# Patient Record
Sex: Male | Born: 1950
Health system: Southern US, Community
[De-identification: ages and names within clinical notes are randomized; demographics above are authoritative.]

## PROBLEM LIST (undated history)

## (undated) DIAGNOSIS — E785 Hyperlipidemia, unspecified: Secondary | ICD-10-CM

## (undated) DIAGNOSIS — I10 Essential (primary) hypertension: Secondary | ICD-10-CM

## (undated) DIAGNOSIS — E039 Hypothyroidism, unspecified: Secondary | ICD-10-CM

## (undated) DIAGNOSIS — I219 Acute myocardial infarction, unspecified: Secondary | ICD-10-CM

## (undated) DIAGNOSIS — M1990 Unspecified osteoarthritis, unspecified site: Secondary | ICD-10-CM

## (undated) DIAGNOSIS — K635 Polyp of colon: Secondary | ICD-10-CM

## (undated) HISTORY — DX: Acute myocardial infarction, unspecified: I21.9

## (undated) HISTORY — DX: Essential (primary) hypertension: I10

## (undated) HISTORY — DX: Polyp of colon: K63.5

## (undated) HISTORY — PX: OTHER SURGICAL HISTORY: SHX169

## (undated) HISTORY — DX: Hyperlipidemia, unspecified: E78.5

## (undated) HISTORY — DX: Hypothyroidism, unspecified: E03.9

---

## 1976-01-05 HISTORY — PX: AMPUTATION FINGER / THUMB: SUR24

## 1998-01-04 HISTORY — PX: ARTHROSCOPIC HAGLUNDS REPAIR: SHX5187

## 1999-08-27 ENCOUNTER — Ambulatory Visit (HOSPITAL_COMMUNITY): Admission: RE | Admit: 1999-08-27 | Discharge: 1999-08-27 | Payer: Self-pay | Admitting: Orthopedic Surgery

## 1999-10-22 ENCOUNTER — Encounter: Payer: Self-pay | Admitting: Emergency Medicine

## 1999-10-22 ENCOUNTER — Emergency Department (HOSPITAL_COMMUNITY): Admission: EM | Admit: 1999-10-22 | Discharge: 1999-10-22 | Payer: Self-pay | Admitting: Emergency Medicine

## 2000-10-14 ENCOUNTER — Ambulatory Visit (HOSPITAL_COMMUNITY): Admission: RE | Admit: 2000-10-14 | Discharge: 2000-10-14 | Payer: Self-pay | Admitting: Gastroenterology

## 2001-10-12 ENCOUNTER — Encounter: Payer: Self-pay | Admitting: *Deleted

## 2001-10-12 ENCOUNTER — Inpatient Hospital Stay (HOSPITAL_COMMUNITY): Admission: EM | Admit: 2001-10-12 | Discharge: 2001-10-17 | Payer: Self-pay | Admitting: Emergency Medicine

## 2001-11-06 ENCOUNTER — Encounter (HOSPITAL_COMMUNITY): Admission: RE | Admit: 2001-11-06 | Discharge: 2002-02-04 | Payer: Self-pay | Admitting: Cardiology

## 2002-07-21 ENCOUNTER — Emergency Department (HOSPITAL_COMMUNITY): Admission: EM | Admit: 2002-07-21 | Discharge: 2002-07-22 | Payer: Self-pay | Admitting: Emergency Medicine

## 2002-07-22 ENCOUNTER — Encounter: Payer: Self-pay | Admitting: Emergency Medicine

## 2005-07-21 ENCOUNTER — Emergency Department (HOSPITAL_COMMUNITY): Admission: EM | Admit: 2005-07-21 | Discharge: 2005-07-21 | Payer: Self-pay | Admitting: Emergency Medicine

## 2011-03-16 ENCOUNTER — Encounter: Payer: Self-pay | Admitting: *Deleted

## 2012-03-09 ENCOUNTER — Encounter: Payer: Self-pay | Admitting: Cardiology

## 2012-04-12 ENCOUNTER — Encounter: Payer: Self-pay | Admitting: Cardiology

## 2012-05-13 ENCOUNTER — Emergency Department (HOSPITAL_COMMUNITY): Payer: BC Managed Care – PPO

## 2012-05-13 ENCOUNTER — Encounter (HOSPITAL_COMMUNITY): Payer: Self-pay | Admitting: Emergency Medicine

## 2012-05-13 ENCOUNTER — Emergency Department (HOSPITAL_COMMUNITY)
Admission: EM | Admit: 2012-05-13 | Discharge: 2012-05-13 | Disposition: A | Discharge status: Home / Self Care | Payer: BC Managed Care – PPO | Attending: Emergency Medicine | Admitting: Emergency Medicine

## 2012-05-13 DIAGNOSIS — Z79899 Other long term (current) drug therapy: Secondary | ICD-10-CM | POA: Insufficient documentation

## 2012-05-13 DIAGNOSIS — Z860100 Personal history of colon polyps, unspecified: Secondary | ICD-10-CM | POA: Insufficient documentation

## 2012-05-13 DIAGNOSIS — I252 Old myocardial infarction: Secondary | ICD-10-CM | POA: Insufficient documentation

## 2012-05-13 DIAGNOSIS — N200 Calculus of kidney: Secondary | ICD-10-CM | POA: Insufficient documentation

## 2012-05-13 DIAGNOSIS — E039 Hypothyroidism, unspecified: Secondary | ICD-10-CM | POA: Insufficient documentation

## 2012-05-13 DIAGNOSIS — R110 Nausea: Secondary | ICD-10-CM | POA: Insufficient documentation

## 2012-05-13 DIAGNOSIS — Z87891 Personal history of nicotine dependence: Secondary | ICD-10-CM | POA: Insufficient documentation

## 2012-05-13 DIAGNOSIS — S68019A Complete traumatic metacarpophalangeal amputation of unspecified thumb, initial encounter: Secondary | ICD-10-CM | POA: Insufficient documentation

## 2012-05-13 DIAGNOSIS — Z7902 Long term (current) use of antithrombotics/antiplatelets: Secondary | ICD-10-CM | POA: Insufficient documentation

## 2012-05-13 DIAGNOSIS — K8020 Calculus of gallbladder without cholecystitis without obstruction: Secondary | ICD-10-CM | POA: Insufficient documentation

## 2012-05-13 DIAGNOSIS — Z7982 Long term (current) use of aspirin: Secondary | ICD-10-CM | POA: Insufficient documentation

## 2012-05-13 DIAGNOSIS — Z9861 Coronary angioplasty status: Secondary | ICD-10-CM | POA: Insufficient documentation

## 2012-05-13 DIAGNOSIS — E785 Hyperlipidemia, unspecified: Secondary | ICD-10-CM | POA: Insufficient documentation

## 2012-05-13 DIAGNOSIS — I10 Essential (primary) hypertension: Secondary | ICD-10-CM | POA: Insufficient documentation

## 2012-05-13 DIAGNOSIS — N23 Unspecified renal colic: Secondary | ICD-10-CM | POA: Insufficient documentation

## 2012-05-13 LAB — URINE MICROSCOPIC-ADD ON

## 2012-05-13 LAB — URINALYSIS, ROUTINE W REFLEX MICROSCOPIC
Bilirubin Urine: NEGATIVE
Glucose, UA: NEGATIVE mg/dL
Ketones, ur: NEGATIVE mg/dL
Leukocytes, UA: NEGATIVE
Nitrite: NEGATIVE
Protein, ur: NEGATIVE mg/dL
Specific Gravity, Urine: 1.026 (ref 1.005–1.030)
Urobilinogen, UA: 0.2 mg/dL (ref 0.0–1.0)
pH: 5 (ref 5.0–8.0)

## 2012-05-13 MED ORDER — HYDROMORPHONE HCL PF 1 MG/ML IJ SOLN
1.0000 mg | Freq: Once | INTRAMUSCULAR | Status: AC
  Administered 2012-05-13: 1 mg via INTRAVENOUS
  Filled 2012-05-13: qty 1

## 2012-05-13 MED ORDER — OXYCODONE-ACETAMINOPHEN 5-325 MG PO TABS: 1.0000 | ORAL_TABLET | Freq: Four times a day (QID) | ORAL | Status: DC | PRN

## 2012-05-13 MED ORDER — ONDANSETRON HCL 4 MG/2ML IJ SOLN
4.0000 mg | Freq: Once | INTRAMUSCULAR | Status: AC
  Administered 2012-05-13: 4 mg via INTRAVENOUS
  Filled 2012-05-13: qty 2

## 2012-05-13 MED ORDER — SULFAMETHOXAZOLE-TMP DS 800-160 MG PO TABS: 1.0000 | ORAL_TABLET | Freq: Once | ORAL | Status: DC

## 2012-05-13 MED ORDER — TAMSULOSIN HCL 0.4 MG PO CAPS: 0.4000 mg | ORAL_CAPSULE | Freq: Every day | ORAL | Status: DC

## 2012-05-13 MED ORDER — ONDANSETRON HCL 8 MG PO TABS: 8.0000 mg | ORAL_TABLET | Freq: Three times a day (TID) | ORAL | Status: DC | PRN

## 2012-05-13 MED ORDER — SODIUM CHLORIDE 0.9 % IV SOLN
INTRAVENOUS | Status: DC
  Administered 2012-05-13: 11:00:00 via INTRAVENOUS

## 2012-05-13 NOTE — ED Provider Notes (Signed)
History     CSN: [id number]  Arrival date & time 05/13/12  1014   First MD Initiated Contact with Patient 05/13/12 1022      Chief Complaint  Patient presents with  . Flank Pain    (Consider location/radiation/quality/duration/timing/severity/associated sxs/prior treatment) Patient is a 62 y.o. male presenting with flank pain. The history is provided by the patient.  Flank Pain Pertinent negatives include no chest pain, no abdominal pain, no headaches and no shortness of breath.  pt with remote hx kidney stones presents w acute onset right flank pain posterior/laterally while resting in bed this morning. Somewhat different from prior kidney stone pain on right side 7-8 yrs ago. No radiation of pain. Pain is constant, dull, mod-severe, without specific exacerbating or alleviating factors. Denies back injury or strain. No rash/lesions to area of pain. No direct trauma. No dysuria, hematuria or other gu c/o. No scrotal or testicular pain or swelling. No anterior/abd pain or distension. No fever or chills.   Past Medical History  Diagnosis Date  . Colon polyps   . Hypothyroidism   . Essential hypertension, benign   . Other and unspecified hyperlipidemia   . MI (myocardial infarction)     Past Surgical History  Procedure Laterality Date  . Arthroscopic haglunds repair  2000  . Amputation finger / thumb  1978  . Cardiac stents      Family History  Problem Relation Age of Onset  . Cancer Mother   . Stroke Father     History  Substance Use Topics  . Smoking status: Former Smoker    Types: Cigarettes    Quit date: 01/05/1975  . Smokeless tobacco: Never Used  . Alcohol Use: No      Review of Systems  Constitutional: Negative for fever and chills.  HENT: Negative for neck pain.   Eyes: Negative for redness.  Respiratory: Negative for cough and shortness of breath.   Cardiovascular: Negative for chest pain.  Gastrointestinal: Positive for nausea. Negative for vomiting,  abdominal pain and diarrhea.  Genitourinary: Positive for flank pain.  Musculoskeletal: Negative for back pain.  Skin: Negative for rash.  Neurological: Negative for headaches.  Hematological: Does not bruise/bleed easily.  Psychiatric/Behavioral: Negative for confusion.    Allergies  Review of patient's allergies indicates no known allergies.  Home Medications   Current Outpatient Rx  Name  Route  Sig  Dispense  Refill  . aspirin 325 MG tablet   Oral   Take 325 mg by mouth daily.         Marland Kitchen atorvastatin (LIPITOR) 10 MG tablet   Oral   Take 10 mg by mouth daily.         . clopidogrel (PLAVIX) 75 MG tablet   Oral   Take 75 mg by mouth daily.         . fenofibrate (TRICOR) 145 MG tablet   Oral   Take 145 mg by mouth daily.         Marland Kitchen levothyroxine (SYNTHROID, LEVOTHROID) 100 MCG tablet   Oral   Take 100 mcg by mouth daily.         . metoprolol succinate (TOPROL-XL) 50 MG 24 hr tablet   Oral   Take 50 mg by mouth daily. Take with or immediately following a meal.         . nitroGLYCERIN (NITROSTAT) 0.4 MG SL tablet   Sublingual   Place 0.4 mg under the tongue every 5 (five) minutes as needed.         Marland Kitchen  ramipril (ALTACE) 10 MG capsule   Oral   Take 10 mg by mouth daily.           BP 140/83  Pulse 60  Temp(Src) 98 F (36.7 C) (Oral)  Ht 6\' 2"  (1.88 m)  Wt 214 lb (97.07 kg)  BMI 27.46 kg/m2  SpO2 97%  Physical Exam  Nursing note and vitals reviewed. Constitutional: He is oriented to person, place, and time. He appears well-developed and well-nourished. No distress.  HENT:  Mouth/Throat: Oropharynx is clear and moist.  Eyes: Conjunctivae are normal.  Neck: Neck supple. No tracheal deviation present.  Cardiovascular: Normal rate, regular rhythm, normal heart sounds and intact distal pulses.   Pulmonary/Chest: Effort normal and breath sounds normal. No accessory muscle usage. No respiratory distress.  Abdominal: Soft. Bowel sounds are normal. He  exhibits no distension and no mass. There is no tenderness. There is no rebound and no guarding.  Genitourinary:  No cva tenderness. No scrotal/testicular pain or tenderness  Musculoskeletal: Normal range of motion. He exhibits no edema.  Neurological: He is alert and oriented to person, place, and time.  Skin: Skin is warm and dry.  Psychiatric: He has a normal mood and affect.    ED Course  Procedures (including critical care time)  Results for orders placed during the hospital encounter of 05/13/12  URINALYSIS, ROUTINE W REFLEX MICROSCOPIC      Result Value Range   Color, Urine YELLOW  YELLOW   APPearance CLOUDY (*) CLEAR   Specific Gravity, Urine 1.026  1.005 - 1.030   pH 5.0  5.0 - 8.0   Glucose, UA NEGATIVE  NEGATIVE mg/dL   Hgb urine dipstick LARGE (*) NEGATIVE   Bilirubin Urine NEGATIVE  NEGATIVE   Ketones, ur NEGATIVE  NEGATIVE mg/dL   Protein, ur NEGATIVE  NEGATIVE mg/dL   Urobilinogen, UA 0.2  0.0 - 1.0 mg/dL   Nitrite NEGATIVE  NEGATIVE   Leukocytes, UA NEGATIVE  NEGATIVE  URINE MICROSCOPIC-ADD ON      Result Value Range   Squamous Epithelial / LPF RARE  RARE   WBC, UA 0-2  <3 WBC/hpf   RBC / HPF 11-20  <3 RBC/hpf   Bacteria, UA RARE  RARE   Urine-Other MUCOUS PRESENT     Ct Abdomen Pelvis Wo Contrast  05/13/2012  *RADIOLOGY REPORT*  Clinical Data: 62 year old male with left flank, abdominal and pelvic pain.  CT ABDOMEN AND PELVIS WITHOUT CONTRAST  Technique:  Multidetector CT imaging of the abdomen and pelvis was performed following the standard protocol without intravenous contrast.  Comparison: 07/22/2002 CT report - images are not available.  Findings: Mild fatty infiltration of the liver is noted. A few tiny gallstones are noted without CT evidence of acute cholecystitis.  A 5 mm left UPJ calculus is identified causing moderate left hydronephrosis.  A nonobstructing 6 mm left upper pole calculus and a 3 mm left lower pole calculus identified. Probable right renal  cysts are present.  The spleen, pancreas, and adrenal glands are unremarkable.  Please note that parenchymal abnormalities may be missed as intravenous contrast was not administered.  No free fluid, enlarged lymph nodes, biliary dilation or abdominal aortic aneurysm identified.  The bowel, appendix and bladder are unremarkable. No acute or suspicious bony abnormalities are identified. Moderate degenerative disc disease and spondylosis at L4-L5 and L5- S1 noted.  IMPRESSION: 5 mm left UPJ calculus causing moderate left hydronephrosis.  Nonobstructing left renal calculi.  Mild fatty infiltration of the liver.  Cholelithiasis.   Original Report Authenticated By: Harmon Pier, M.D.        MDM  Iv ns. Dilaudid iv. zofran iv. Ua. Ct.  Reviewed nursing notes and prior charts for additional history.   Recheck pain persists. Dilaudid 1 mg iv.  Pain resolved.  Ct returns, discussed w pt/spouse incl left kidney stones, left upj stone (todays pain), and gallstones.  Pt states has seen urologist in past in [place] and willl plan to f/u there Monday.  Recheck pain resolved.   Stable for d/c.     Suzi Roots, MD 05/13/12 1147

## 2012-05-13 NOTE — ED Notes (Signed)
MD Steinl at bedside. 

## 2012-05-13 NOTE — ED Notes (Signed)
Patient presents to ED today with c/o left flank pain. Pt states he feels like he has had kidney stones before and this feels the same. NAD.

## 2012-05-14 ENCOUNTER — Emergency Department (HOSPITAL_COMMUNITY)
Admission: EM | Admit: 2012-05-14 | Discharge: 2012-05-14 | Disposition: A | Discharge status: Home / Self Care | Payer: BC Managed Care – PPO | Attending: Emergency Medicine | Admitting: Emergency Medicine

## 2012-05-14 ENCOUNTER — Encounter (HOSPITAL_COMMUNITY): Payer: Self-pay | Admitting: Emergency Medicine

## 2012-05-14 DIAGNOSIS — I252 Old myocardial infarction: Secondary | ICD-10-CM | POA: Insufficient documentation

## 2012-05-14 DIAGNOSIS — Z860100 Personal history of colon polyps, unspecified: Secondary | ICD-10-CM | POA: Insufficient documentation

## 2012-05-14 DIAGNOSIS — I10 Essential (primary) hypertension: Secondary | ICD-10-CM | POA: Insufficient documentation

## 2012-05-14 DIAGNOSIS — Z79899 Other long term (current) drug therapy: Secondary | ICD-10-CM | POA: Insufficient documentation

## 2012-05-14 DIAGNOSIS — E039 Hypothyroidism, unspecified: Secondary | ICD-10-CM | POA: Insufficient documentation

## 2012-05-14 DIAGNOSIS — Z7982 Long term (current) use of aspirin: Secondary | ICD-10-CM | POA: Insufficient documentation

## 2012-05-14 DIAGNOSIS — E785 Hyperlipidemia, unspecified: Secondary | ICD-10-CM | POA: Insufficient documentation

## 2012-05-14 DIAGNOSIS — Z87891 Personal history of nicotine dependence: Secondary | ICD-10-CM | POA: Insufficient documentation

## 2012-05-14 DIAGNOSIS — N201 Calculus of ureter: Secondary | ICD-10-CM

## 2012-05-14 LAB — URINALYSIS, ROUTINE W REFLEX MICROSCOPIC
Bilirubin Urine: NEGATIVE
Glucose, UA: NEGATIVE mg/dL
Ketones, ur: NEGATIVE mg/dL
Nitrite: NEGATIVE
Protein, ur: NEGATIVE mg/dL
Specific Gravity, Urine: 1.007 (ref 1.005–1.030)
Urobilinogen, UA: 0.2 mg/dL (ref 0.0–1.0)
pH: 6 (ref 5.0–8.0)

## 2012-05-14 LAB — URINE MICROSCOPIC-ADD ON

## 2012-05-14 MED ORDER — HYDROMORPHONE HCL PF 1 MG/ML IJ SOLN
1.0000 mg | Freq: Once | INTRAMUSCULAR | Status: AC
  Administered 2012-05-14: 1 mg via INTRAVENOUS
  Filled 2012-05-14: qty 1

## 2012-05-14 MED ORDER — SODIUM CHLORIDE 0.9 % IV SOLN
Freq: Once | INTRAVENOUS | Status: AC
  Administered 2012-05-14: 13:00:00 via INTRAVENOUS

## 2012-05-14 MED ORDER — ONDANSETRON HCL 4 MG/2ML IJ SOLN
4.0000 mg | Freq: Once | INTRAMUSCULAR | Status: AC
  Administered 2012-05-14: 4 mg via INTRAVENOUS
  Filled 2012-05-14: qty 2

## 2012-05-14 NOTE — ED Notes (Signed)
Pt states symptoms started this morning around 10am. Pt was seen in ED yesterday but has returned today because pain was intense. Pt reports pain is located in LL abdomen and left side of back. Pt has a kidney stone and states he was suppose to the doctor tomorrow. D/c with pain medicine, which was taken but there was not much relief.

## 2012-05-14 NOTE — ED Provider Notes (Signed)
History     CSN: [id number]  Arrival date & time 05/14/12  1126   First MD Initiated Contact with Patient 05/14/12 1155      Chief Complaint  Patient presents with  . Flank Pain    (Consider location/radiation/quality/duration/timing/severity/associated sxs/prior treatment) HPI Patient was seen in the ED yesterday and diagnosed with a 5 mm proximal UPJ stone. He reports he did well during the night however about 9:50 this morning he had acute onset of severe left-sided flank pain that radiates down to his left mid abdomen. He states he took his pain medicines at home without relief. He denies nausea or vomiting but does state she has dry heaves. He denies fever. He does however state he has been having chills. He denies visual hematuria. He states he is seeing Dr. Wanda Plump, urologist in the past and he is going to see him this week.  PCP Dr Neale Burly Urologist Dr Wanda Plump  Past Medical History  Diagnosis Date  . Colon polyps   . Hypothyroidism   . Essential hypertension, benign   . Other and unspecified hyperlipidemia   . MI (myocardial infarction)     Past Surgical History  Procedure Laterality Date  . Arthroscopic haglunds repair  2000  . Amputation finger / thumb  1978  . Cardiac stents      Family History  Problem Relation Age of Onset  . Cancer Mother   . Stroke Father     History  Substance Use Topics  . Smoking status: Former Smoker    Types: Cigarettes    Quit date: 01/05/1975  . Smokeless tobacco: Never Used  . Alcohol Use: No   Patient lives at home Patient lives with spouse   Review of Systems  All other systems reviewed and are negative.    Allergies  Review of patient's allergies indicates no known allergies.  Home Medications   Current Outpatient Rx  Name  Route  Sig  Dispense  Refill  . aspirin EC 81 MG tablet   Oral   Take 81 mg by mouth daily.         . fenofibrate (TRICOR) 145 MG tablet   Oral   Take 145 mg by mouth daily.         Marland Kitchen levothyroxine (SYNTHROID, LEVOTHROID) 100 MCG tablet   Oral   Take 100 mcg by mouth daily.         Marland Kitchen loratadine (CLARITIN) 10 MG tablet   Oral   Take 10 mg by mouth daily as needed for allergies.         . nitroGLYCERIN (NITROSTAT) 0.4 MG SL tablet   Sublingual   Place 0.4 mg under the tongue every 5 (five) minutes as needed.         . ramipril (ALTACE) 10 MG capsule   Oral   Take 10 mg by mouth daily.         . rosuvastatin (CRESTOR) 20 MG tablet   Oral   Take 20 mg by mouth daily.           BP 126/82  Pulse 72  Temp(Src) 98 F (36.7 C) (Oral)  Resp 18  SpO2 98%  Vital signs normal    Physical Exam  Nursing note and vitals reviewed. Constitutional: He is oriented to person, place, and time. He appears well-developed and well-nourished.  Non-toxic appearance. He does not appear ill. No distress.  Patient appears uncomfortable  HENT:  Head: Normocephalic and atraumatic.  Right Ear: External ear  normal.  Left Ear: External ear normal.  Nose: Nose normal. No mucosal edema or rhinorrhea.  Mouth/Throat: Oropharynx is clear and moist and mucous membranes are normal. No dental abscesses or edematous.  Eyes: Conjunctivae and EOM are normal. Pupils are equal, round, and reactive to light.  Neck: Normal range of motion and full passive range of motion without pain. Neck supple.  Cardiovascular: Normal rate, regular rhythm and normal heart sounds.  Exam reveals no gallop and no friction rub.   No murmur heard. Pulmonary/Chest: Effort normal and breath sounds normal. No respiratory distress. He has no wheezes. He has no rhonchi. He has no rales. He exhibits no tenderness and no crepitus.  Abdominal: Soft. Normal appearance and bowel sounds are normal. He exhibits no distension. There is no tenderness. There is no rebound and no guarding.  Patient has no pain to palpation of his abdomen or flank but does indicate his pain is in the left flank and left abdomen.   Musculoskeletal: Normal range of motion. He exhibits no edema and no tenderness.  Moves all extremities well.   Neurological: He is alert and oriented to person, place, and time. He has normal strength. No cranial nerve deficit.  Skin: Skin is warm, dry and intact. No rash noted. No erythema. No pallor.  Psychiatric: He has a normal mood and affect. His speech is normal and behavior is normal. His mood appears not anxious.    ED Course  Procedures (including critical care time)  Medications  0.9 %  sodium chloride infusion ( Intravenous Stopped 05/14/12 1349)  HYDROmorphone (DILAUDID) injection 1 mg (1 mg Intravenous Given 05/14/12 1251)  ondansetron (ZOFRAN) injection 4 mg (4 mg Intravenous Given 05/14/12 1250)   Patient's pain was much improved with IV medications. He feels ready to be discharged. UA was done to make sure he has not developed a UTI because he was having symptoms of chills without obvious fever.  Patient reports he still has plenty of pain medication to take at home.  Results for orders placed during the hospital encounter of 05/14/12  URINALYSIS, ROUTINE W REFLEX MICROSCOPIC      Result Value Range   Color, Urine YELLOW  YELLOW   APPearance CLOUDY (*) CLEAR   Specific Gravity, Urine 1.007  1.005 - 1.030   pH 6.0  5.0 - 8.0   Glucose, UA NEGATIVE  NEGATIVE mg/dL   Hgb urine dipstick LARGE (*) NEGATIVE   Bilirubin Urine NEGATIVE  NEGATIVE   Ketones, ur NEGATIVE  NEGATIVE mg/dL   Protein, ur NEGATIVE  NEGATIVE mg/dL   Urobilinogen, UA 0.2  0.0 - 1.0 mg/dL   Nitrite NEGATIVE  NEGATIVE   Leukocytes, UA SMALL (*) NEGATIVE  URINE MICROSCOPIC-ADD ON      Result Value Range   WBC, UA 0-2  <3 WBC/hpf   RBC / HPF 3-6  <3 RBC/hpf   Laboratory interpretation all normal except hematuria, no evidence of UTI   Ct Abdomen Pelvis Wo Contrast  05/13/2012  IMPRESSION: 5 mm left UPJ calculus causing moderate left hydronephrosis.  Nonobstructing left renal calculi.  Mild fatty  infiltration of the liver.  Cholelithiasis.   Original Report Authenticated By: Harmon Pier, M.D.      1. Ureteral calculus     Plan discharge  Devoria Albe, MD, FACEP   MDM          Ward Givens, MD 05/14/12 [phone]

## 2014-05-21 DIAGNOSIS — I10 Essential (primary) hypertension: Secondary | ICD-10-CM | POA: Diagnosis not present

## 2014-05-21 DIAGNOSIS — E039 Hypothyroidism, unspecified: Secondary | ICD-10-CM | POA: Diagnosis not present

## 2014-05-21 DIAGNOSIS — E785 Hyperlipidemia, unspecified: Secondary | ICD-10-CM | POA: Diagnosis not present

## 2014-05-21 DIAGNOSIS — I2510 Atherosclerotic heart disease of native coronary artery without angina pectoris: Secondary | ICD-10-CM | POA: Diagnosis not present

## 2014-05-21 DIAGNOSIS — Z125 Encounter for screening for malignant neoplasm of prostate: Secondary | ICD-10-CM | POA: Diagnosis not present

## 2014-05-24 DIAGNOSIS — G4700 Insomnia, unspecified: Secondary | ICD-10-CM | POA: Diagnosis not present

## 2014-05-24 DIAGNOSIS — E039 Hypothyroidism, unspecified: Secondary | ICD-10-CM | POA: Diagnosis not present

## 2014-05-24 DIAGNOSIS — N200 Calculus of kidney: Secondary | ICD-10-CM | POA: Diagnosis not present

## 2014-05-24 DIAGNOSIS — Z6833 Body mass index (BMI) 33.0-33.9, adult: Secondary | ICD-10-CM | POA: Diagnosis not present

## 2014-05-24 DIAGNOSIS — D692 Other nonthrombocytopenic purpura: Secondary | ICD-10-CM | POA: Diagnosis not present

## 2014-05-24 DIAGNOSIS — I10 Essential (primary) hypertension: Secondary | ICD-10-CM | POA: Diagnosis not present

## 2014-05-24 DIAGNOSIS — I2510 Atherosclerotic heart disease of native coronary artery without angina pectoris: Secondary | ICD-10-CM | POA: Diagnosis not present

## 2014-05-24 DIAGNOSIS — Z0000 Encounter for general adult medical examination without abnormal findings: Secondary | ICD-10-CM | POA: Diagnosis not present

## 2014-05-24 DIAGNOSIS — E785 Hyperlipidemia, unspecified: Secondary | ICD-10-CM | POA: Diagnosis not present

## 2014-05-24 DIAGNOSIS — I252 Old myocardial infarction: Secondary | ICD-10-CM | POA: Diagnosis not present

## 2014-05-24 DIAGNOSIS — E669 Obesity, unspecified: Secondary | ICD-10-CM | POA: Diagnosis not present

## 2015-04-13 DIAGNOSIS — J209 Acute bronchitis, unspecified: Secondary | ICD-10-CM | POA: Diagnosis not present

## 2015-05-29 DIAGNOSIS — Z125 Encounter for screening for malignant neoplasm of prostate: Secondary | ICD-10-CM | POA: Diagnosis not present

## 2015-05-29 DIAGNOSIS — E038 Other specified hypothyroidism: Secondary | ICD-10-CM | POA: Diagnosis not present

## 2015-05-29 DIAGNOSIS — I10 Essential (primary) hypertension: Secondary | ICD-10-CM | POA: Diagnosis not present

## 2015-06-05 DIAGNOSIS — G4700 Insomnia, unspecified: Secondary | ICD-10-CM | POA: Diagnosis not present

## 2015-06-05 DIAGNOSIS — Z23 Encounter for immunization: Secondary | ICD-10-CM | POA: Diagnosis not present

## 2015-06-05 DIAGNOSIS — E669 Obesity, unspecified: Secondary | ICD-10-CM | POA: Diagnosis not present

## 2015-06-05 DIAGNOSIS — E038 Other specified hypothyroidism: Secondary | ICD-10-CM | POA: Diagnosis not present

## 2015-06-05 DIAGNOSIS — I10 Essential (primary) hypertension: Secondary | ICD-10-CM | POA: Diagnosis not present

## 2015-06-05 DIAGNOSIS — D692 Other nonthrombocytopenic purpura: Secondary | ICD-10-CM | POA: Diagnosis not present

## 2015-06-05 DIAGNOSIS — I2510 Atherosclerotic heart disease of native coronary artery without angina pectoris: Secondary | ICD-10-CM | POA: Diagnosis not present

## 2015-06-05 DIAGNOSIS — E7800 Pure hypercholesterolemia, unspecified: Secondary | ICD-10-CM | POA: Diagnosis not present

## 2015-06-05 DIAGNOSIS — I252 Old myocardial infarction: Secondary | ICD-10-CM | POA: Diagnosis not present

## 2015-06-05 DIAGNOSIS — Z1389 Encounter for screening for other disorder: Secondary | ICD-10-CM | POA: Diagnosis not present

## 2015-06-05 DIAGNOSIS — Z0000 Encounter for general adult medical examination without abnormal findings: Secondary | ICD-10-CM | POA: Diagnosis not present

## 2015-06-05 DIAGNOSIS — I119 Hypertensive heart disease without heart failure: Secondary | ICD-10-CM | POA: Diagnosis not present

## 2015-06-19 DIAGNOSIS — I2510 Atherosclerotic heart disease of native coronary artery without angina pectoris: Secondary | ICD-10-CM | POA: Diagnosis not present

## 2015-06-19 DIAGNOSIS — E039 Hypothyroidism, unspecified: Secondary | ICD-10-CM | POA: Diagnosis not present

## 2015-06-19 DIAGNOSIS — T63481A Toxic effect of venom of other arthropod, accidental (unintentional), initial encounter: Secondary | ICD-10-CM | POA: Diagnosis not present

## 2015-06-19 DIAGNOSIS — I10 Essential (primary) hypertension: Secondary | ICD-10-CM | POA: Diagnosis not present

## 2015-06-19 DIAGNOSIS — T63441A Toxic effect of venom of bees, accidental (unintentional), initial encounter: Secondary | ICD-10-CM | POA: Diagnosis not present

## 2015-06-19 DIAGNOSIS — E7800 Pure hypercholesterolemia, unspecified: Secondary | ICD-10-CM | POA: Diagnosis not present

## 2015-06-19 DIAGNOSIS — L509 Urticaria, unspecified: Secondary | ICD-10-CM | POA: Diagnosis not present

## 2015-09-23 DIAGNOSIS — Z1211 Encounter for screening for malignant neoplasm of colon: Secondary | ICD-10-CM | POA: Diagnosis not present

## 2015-09-23 DIAGNOSIS — Z86010 Personal history of colonic polyps: Secondary | ICD-10-CM | POA: Diagnosis not present

## 2015-09-23 DIAGNOSIS — K5900 Constipation, unspecified: Secondary | ICD-10-CM | POA: Diagnosis not present

## 2015-09-23 DIAGNOSIS — E669 Obesity, unspecified: Secondary | ICD-10-CM | POA: Diagnosis not present

## 2015-10-18 DIAGNOSIS — Z23 Encounter for immunization: Secondary | ICD-10-CM | POA: Diagnosis not present

## 2015-10-22 DIAGNOSIS — D122 Benign neoplasm of ascending colon: Secondary | ICD-10-CM | POA: Diagnosis not present

## 2015-10-22 DIAGNOSIS — K635 Polyp of colon: Secondary | ICD-10-CM | POA: Diagnosis not present

## 2015-10-22 DIAGNOSIS — Z86010 Personal history of colonic polyps: Secondary | ICD-10-CM | POA: Diagnosis not present

## 2015-10-22 DIAGNOSIS — Z1211 Encounter for screening for malignant neoplasm of colon: Secondary | ICD-10-CM | POA: Diagnosis not present

## 2016-01-14 DIAGNOSIS — H40053 Ocular hypertension, bilateral: Secondary | ICD-10-CM | POA: Diagnosis not present

## 2016-06-04 DIAGNOSIS — E038 Other specified hypothyroidism: Secondary | ICD-10-CM | POA: Diagnosis not present

## 2016-06-04 DIAGNOSIS — E7800 Pure hypercholesterolemia, unspecified: Secondary | ICD-10-CM | POA: Diagnosis not present

## 2016-06-04 DIAGNOSIS — I10 Essential (primary) hypertension: Secondary | ICD-10-CM | POA: Diagnosis not present

## 2016-06-04 DIAGNOSIS — Z125 Encounter for screening for malignant neoplasm of prostate: Secondary | ICD-10-CM | POA: Diagnosis not present

## 2016-06-11 DIAGNOSIS — Z6834 Body mass index (BMI) 34.0-34.9, adult: Secondary | ICD-10-CM | POA: Diagnosis not present

## 2016-06-11 DIAGNOSIS — E7800 Pure hypercholesterolemia, unspecified: Secondary | ICD-10-CM | POA: Diagnosis not present

## 2016-06-11 DIAGNOSIS — Z0000 Encounter for general adult medical examination without abnormal findings: Secondary | ICD-10-CM | POA: Diagnosis not present

## 2016-06-11 DIAGNOSIS — Z23 Encounter for immunization: Secondary | ICD-10-CM | POA: Diagnosis not present

## 2016-06-11 DIAGNOSIS — I2510 Atherosclerotic heart disease of native coronary artery without angina pectoris: Secondary | ICD-10-CM | POA: Diagnosis not present

## 2016-06-11 DIAGNOSIS — E038 Other specified hypothyroidism: Secondary | ICD-10-CM | POA: Diagnosis not present

## 2016-06-11 DIAGNOSIS — Z1389 Encounter for screening for other disorder: Secondary | ICD-10-CM | POA: Diagnosis not present

## 2016-06-11 DIAGNOSIS — E668 Other obesity: Secondary | ICD-10-CM | POA: Diagnosis not present

## 2016-06-11 DIAGNOSIS — D692 Other nonthrombocytopenic purpura: Secondary | ICD-10-CM | POA: Diagnosis not present

## 2016-06-11 DIAGNOSIS — I252 Old myocardial infarction: Secondary | ICD-10-CM | POA: Diagnosis not present

## 2016-06-11 DIAGNOSIS — G4709 Other insomnia: Secondary | ICD-10-CM | POA: Diagnosis not present

## 2016-06-11 DIAGNOSIS — I119 Hypertensive heart disease without heart failure: Secondary | ICD-10-CM | POA: Diagnosis not present

## 2016-08-13 DIAGNOSIS — S6991XA Unspecified injury of right wrist, hand and finger(s), initial encounter: Secondary | ICD-10-CM | POA: Diagnosis not present

## 2016-08-13 DIAGNOSIS — Z472 Encounter for removal of internal fixation device: Secondary | ICD-10-CM | POA: Diagnosis not present

## 2016-08-13 DIAGNOSIS — S61031A Puncture wound without foreign body of right thumb without damage to nail, initial encounter: Secondary | ICD-10-CM | POA: Diagnosis not present

## 2016-08-13 DIAGNOSIS — Z0389 Encounter for observation for other suspected diseases and conditions ruled out: Secondary | ICD-10-CM | POA: Diagnosis not present

## 2016-08-13 DIAGNOSIS — S60351A Superficial foreign body of right thumb, initial encounter: Secondary | ICD-10-CM | POA: Diagnosis not present

## 2016-08-24 DIAGNOSIS — L821 Other seborrheic keratosis: Secondary | ICD-10-CM | POA: Diagnosis not present

## 2016-08-24 DIAGNOSIS — X32XXXA Exposure to sunlight, initial encounter: Secondary | ICD-10-CM | POA: Diagnosis not present

## 2016-08-24 DIAGNOSIS — L820 Inflamed seborrheic keratosis: Secondary | ICD-10-CM | POA: Diagnosis not present

## 2016-08-24 DIAGNOSIS — D225 Melanocytic nevi of trunk: Secondary | ICD-10-CM | POA: Diagnosis not present

## 2016-08-24 DIAGNOSIS — L570 Actinic keratosis: Secondary | ICD-10-CM | POA: Diagnosis not present

## 2017-04-14 ENCOUNTER — Encounter (HOSPITAL_COMMUNITY): Payer: Self-pay | Admitting: *Deleted

## 2017-04-14 ENCOUNTER — Emergency Department (HOSPITAL_COMMUNITY)
Admission: EM | Admit: 2017-04-14 | Discharge: 2017-04-14 | Disposition: A | Discharge status: Home / Self Care | Payer: Medicare Other | Attending: Emergency Medicine | Admitting: Emergency Medicine

## 2017-04-14 ENCOUNTER — Emergency Department (HOSPITAL_COMMUNITY): Payer: Medicare Other

## 2017-04-14 DIAGNOSIS — R0981 Nasal congestion: Secondary | ICD-10-CM | POA: Insufficient documentation

## 2017-04-14 DIAGNOSIS — Z89029 Acquired absence of unspecified finger(s): Secondary | ICD-10-CM | POA: Insufficient documentation

## 2017-04-14 DIAGNOSIS — R05 Cough: Secondary | ICD-10-CM | POA: Insufficient documentation

## 2017-04-14 DIAGNOSIS — M25461 Effusion, right knee: Secondary | ICD-10-CM | POA: Insufficient documentation

## 2017-04-14 DIAGNOSIS — M7989 Other specified soft tissue disorders: Secondary | ICD-10-CM | POA: Diagnosis not present

## 2017-04-14 DIAGNOSIS — Z87891 Personal history of nicotine dependence: Secondary | ICD-10-CM | POA: Insufficient documentation

## 2017-04-14 DIAGNOSIS — M25561 Pain in right knee: Secondary | ICD-10-CM | POA: Insufficient documentation

## 2017-04-14 DIAGNOSIS — I10 Essential (primary) hypertension: Secondary | ICD-10-CM | POA: Insufficient documentation

## 2017-04-14 DIAGNOSIS — E039 Hypothyroidism, unspecified: Secondary | ICD-10-CM | POA: Insufficient documentation

## 2017-04-14 DIAGNOSIS — W11XXXA Fall on and from ladder, initial encounter: Secondary | ICD-10-CM | POA: Diagnosis not present

## 2017-04-14 DIAGNOSIS — Z7982 Long term (current) use of aspirin: Secondary | ICD-10-CM | POA: Diagnosis not present

## 2017-04-14 DIAGNOSIS — R509 Fever, unspecified: Secondary | ICD-10-CM | POA: Diagnosis not present

## 2017-04-14 DIAGNOSIS — Z79899 Other long term (current) drug therapy: Secondary | ICD-10-CM | POA: Diagnosis not present

## 2017-04-14 DIAGNOSIS — Z89019 Acquired absence of unspecified thumb: Secondary | ICD-10-CM | POA: Insufficient documentation

## 2017-04-14 DIAGNOSIS — J3489 Other specified disorders of nose and nasal sinuses: Secondary | ICD-10-CM | POA: Diagnosis not present

## 2017-04-14 NOTE — Progress Notes (Signed)
Orthopedic Tech Progress Note Patient Details:  Andrew Greer 1950-04-29 [id number]  Ortho Devices Type of Ortho Device: Knee Immobilizer Ortho Device/Splint Location: RLE Ortho Device/Splint Interventions: Ordered, Application   Post Interventions Patient Tolerated: Well Instructions Provided: Care of device   [name] 04/14/2017, 9:45 PM

## 2017-04-14 NOTE — ED Provider Notes (Signed)
[facility] EMERGENCY DEPARTMENT Provider Note   CSN: [id number] Arrival date & time: 04/14/17  1716     History   Chief Complaint Chief Complaint  Patient presents with  . Knee Injury    HPI Andrew Greer is a 67 y.o. male.  Andrew Greer is a 67 y.o. Male with history of prior MI, hypertension, hypothyroidism, hyperlipidemia, who presents to the ED for evaluation of right knee injury.  Patient reports he was getting down from a ladder around 9 AM this morning and when he stepped down onto the ground he landed on a rock and twisted his knee causing him to go down onto his right knee and right hip.  Patient reports initially his hip was hurting more than his knee, but he is continued to be ambulatory throughout the day and continued working.  Patient reports throughout the day he is have progressive swelling and continued pain in the right knee.  Patient reports his hip actually is not bothering him at all anymore.  He reports his knee feels a bit wobbly and unstable.  He bought a knee brace at [facility], but reports it has not helped much.  Has not taken anything for his pain prior to arrival.  He denies any overlying erythema, reports pain and swelling is primarily located over the medial joint line     Past Medical History:  Diagnosis Date  . Colon polyps   . Essential hypertension, benign   . Hypothyroidism   . MI (myocardial infarction) ([facility])   . Other and unspecified hyperlipidemia     There are no active problems to display for this patient.   Past Surgical History:  Procedure Laterality Date  . AMPUTATION FINGER / THUMB  1978  . ARTHROSCOPIC HAGLUNDS REPAIR  2000  . cardiac stents          Home Medications    Prior to Admission medications   Medication Sig Start Date End Date Taking? Authorizing Provider  aspirin EC 81 MG tablet Take 81 mg by mouth daily.    [provider]  fenofibrate (TRICOR) 145 MG tablet Take 145 mg by mouth daily.     [provider]  levothyroxine (SYNTHROID, LEVOTHROID) 100 MCG tablet Take 100 mcg by mouth daily.    [provider]  loratadine (CLARITIN) 10 MG tablet Take 10 mg by mouth daily as needed for allergies.    [provider]  nitroGLYCERIN (NITROSTAT) 0.4 MG SL tablet Place 0.4 mg under the tongue every 5 (five) minutes as needed.    [provider]  ramipril (ALTACE) 10 MG capsule Take 10 mg by mouth daily.    [provider]  rosuvastatin (CRESTOR) 20 MG tablet Take 20 mg by mouth daily.    [provider]    Family History Family History  Problem Relation Age of Onset  . Cancer Mother   . Stroke Father     Social History Social History   Tobacco Use  . Smoking status: Former Smoker    Types: Cigarettes    Last attempt to quit: 01/05/1975    Years since quitting: 42.3  . Smokeless tobacco: Never Used  Substance Use Topics  . Alcohol use: No  . Drug use: No     Allergies   Patient has no known allergies.   Review of Systems Review of Systems  Constitutional: Negative for chills and fever.  HENT: Positive for congestion and rhinorrhea. Negative for ear discharge, ear  pain and sore throat.   Respiratory: Positive for cough. Negative for shortness of breath and wheezing.   Cardiovascular: Negative for chest pain.  Gastrointestinal: Negative for abdominal pain, nausea and vomiting.  Genitourinary: Negative for dysuria and frequency.  Musculoskeletal: Positive for arthralgias and joint swelling.  Skin: Negative for color change and rash.  Neurological: Negative for weakness and numbness.     Physical Exam Updated Vital Signs BP (!) 157/93 (BP Location: Right Arm)   Pulse 87   Temp 100.2 F (37.9 C) (Oral)   Resp 16   SpO2 99%   Physical Exam  Constitutional: He appears well-developed and well-nourished. No distress.  HENT:  Head: Normocephalic and atraumatic.  Moderate nasal mucosa edema with clear  rhinorrhea, posterior oropharynx clear and moist, with some erythema, no edema or exudates  Eyes: Right eye exhibits no discharge. Left eye exhibits no discharge.  Neck: Neck supple.  Cardiovascular: Normal rate, regular rhythm and normal heart sounds.  Pulmonary/Chest: Effort normal and breath sounds normal. No stridor. No respiratory distress. He has no wheezes. He has no rales.  Musculoskeletal:  Right knee with tenderness and swelling over the medial joint line, no overlying erythema, no obvious palpable deformity.  No posterior or lateral tenderness.  Pt is able to fully extend and flex the knee with some discomfort.  2+ DP and TP pulses, sensation intact, 5/5 strength with dorsi and plantar flexion. No tenderness to palpation over right hip, normal ROM.  Weightbearing limited by pain.  Lymphadenopathy:    He has no cervical adenopathy.  Neurological: He is alert. Coordination normal.  Skin: Skin is warm and dry. Capillary refill takes less than 2 seconds. He is not diaphoretic.  Psychiatric: He has a normal mood and affect. His behavior is normal.  Nursing note and vitals reviewed.    ED Treatments / Results  Labs (all labs ordered are listed, but only abnormal results are displayed) Labs Reviewed - No data to display  EKG None  Radiology Dg Knee Complete 4 Views Right  Result Date: 04/14/2017 CLINICAL DATA:  Twisting injury of the right knee. Possible knee dislocation. EXAM: RIGHT KNEE - COMPLETE 4+ VIEW COMPARISON:  None. FINDINGS: Slight medial femorotibial joint space narrowing with spurring off the medial tibial plateau, degenerative in etiology. Small suprapatellar joint effusion is noted. No joint dislocation is seen. There is no acute fracture. Mild soft tissue induration along the anterior aspect of the knee. IMPRESSION: 1. Medial femorotibial joint space narrowing spurring consistent with mild osteoarthritic change. 2. Small suprapatellar joint effusion. No acute fracture  nor joint dislocation. Electronically Signed   By: Ashley Royalty M.D.   On: 04/14/2017 18:49    Procedures Procedures (including critical care time)  Medications Ordered in ED Medications - No data to display   Initial Impression / Assessment and Plan / ED Course  I have reviewed the triage vital signs and the nursing notes.  Pertinent labs & imaging results that were available during my care of the patient were reviewed by me and considered in my medical decision making (see chart for details).  Patient presents to the ED for evaluation of right knee injury, twisted it coming down from ladder.  Pain and swelling over the medial joint line.  No obvious deformity and neurovascularly intact, normal range of motion some discomfort.  X-ray shows no acute fracture or joint dislocation, does show medial femorotibial joint space narrowing and spurring consistent with osteoarthritis and a small suprapatellar joint effusion.  There  is no overlying erythema or warmth to suggest septic arthritis.  Patient does have low-grade fever here in the emergency department, has been dealing with viral upper respiratory illness.  Lungs are clear to auscultation patient without tachypnea or hypoxia.  Offered chest x-ray but patient declined reports he is feeling well.  Do not think fever is due to joint infection given obvious mechanism of injury.  There is no tenderness over the right hip which patient also fell on during injury today, normal range of motion.  Patient declines x-ray at this time.  Has been bearing weight on the hip without difficulty throughout the day.   Patient placed in the immobilizer, offered crutches but reports he has several sets at home.  Patient to treat with anti-inflammatories, ice and elevation.  Patient to follow-up with orthopedics if pain not improving with this conservative therapy.  Return precautions discussed.  Patient expresses understanding and is in agreement with plan.  Patient  discussed with Dr. Sherry Ruffing, who saw patient as well and agrees with plan.   Final Clinical Impressions(s) / ED Diagnoses   Final diagnoses:  Pain and swelling of right knee    ED Discharge Orders    None       Jacqlyn Larsen, [place] 04/15/17 0205    Tegeler, Gwenyth Allegra, MD 04/15/17 [phone]

## 2017-04-14 NOTE — ED Triage Notes (Signed)
Pt in c/o right knee injury this am, states he tripped and twisted it, swelling noted in triage, ambulatory

## 2017-04-14 NOTE — Discharge Instructions (Signed)
Please remain in knee immobilizer, use crutches you have at home, Tylenol or ibuprofen for pain.  Elevate and use ice.  If pain is not improving please follow-up with orthopedics.  Return to the emergency department for severely worsening pain, swelling, redness or warmth, fevers or other new or concerning symptoms.

## 2017-06-10 DIAGNOSIS — I10 Essential (primary) hypertension: Secondary | ICD-10-CM | POA: Diagnosis not present

## 2017-06-10 DIAGNOSIS — E7800 Pure hypercholesterolemia, unspecified: Secondary | ICD-10-CM | POA: Diagnosis not present

## 2017-06-10 DIAGNOSIS — R82998 Other abnormal findings in urine: Secondary | ICD-10-CM | POA: Diagnosis not present

## 2017-06-10 DIAGNOSIS — E038 Other specified hypothyroidism: Secondary | ICD-10-CM | POA: Diagnosis not present

## 2017-06-10 DIAGNOSIS — Z125 Encounter for screening for malignant neoplasm of prostate: Secondary | ICD-10-CM | POA: Diagnosis not present

## 2017-06-17 DIAGNOSIS — Z6833 Body mass index (BMI) 33.0-33.9, adult: Secondary | ICD-10-CM | POA: Diagnosis not present

## 2017-06-17 DIAGNOSIS — I119 Hypertensive heart disease without heart failure: Secondary | ICD-10-CM | POA: Diagnosis not present

## 2017-06-17 DIAGNOSIS — D692 Other nonthrombocytopenic purpura: Secondary | ICD-10-CM | POA: Diagnosis not present

## 2017-06-17 DIAGNOSIS — E7800 Pure hypercholesterolemia, unspecified: Secondary | ICD-10-CM | POA: Diagnosis not present

## 2017-06-17 DIAGNOSIS — I2510 Atherosclerotic heart disease of native coronary artery without angina pectoris: Secondary | ICD-10-CM | POA: Diagnosis not present

## 2017-06-17 DIAGNOSIS — G4709 Other insomnia: Secondary | ICD-10-CM | POA: Diagnosis not present

## 2017-06-17 DIAGNOSIS — Z0000 Encounter for general adult medical examination without abnormal findings: Secondary | ICD-10-CM | POA: Diagnosis not present

## 2017-06-17 DIAGNOSIS — M25561 Pain in right knee: Secondary | ICD-10-CM | POA: Diagnosis not present

## 2017-06-17 DIAGNOSIS — I252 Old myocardial infarction: Secondary | ICD-10-CM | POA: Diagnosis not present

## 2017-06-17 DIAGNOSIS — Z1389 Encounter for screening for other disorder: Secondary | ICD-10-CM | POA: Diagnosis not present

## 2017-06-17 DIAGNOSIS — I10 Essential (primary) hypertension: Secondary | ICD-10-CM | POA: Diagnosis not present

## 2017-06-17 DIAGNOSIS — E038 Other specified hypothyroidism: Secondary | ICD-10-CM | POA: Diagnosis not present

## 2017-08-22 DIAGNOSIS — H43812 Vitreous degeneration, left eye: Secondary | ICD-10-CM | POA: Diagnosis not present

## 2017-09-07 DIAGNOSIS — H43812 Vitreous degeneration, left eye: Secondary | ICD-10-CM | POA: Diagnosis not present

## 2017-11-18 DIAGNOSIS — Z23 Encounter for immunization: Secondary | ICD-10-CM | POA: Diagnosis not present

## 2017-12-20 DIAGNOSIS — L720 Epidermal cyst: Secondary | ICD-10-CM | POA: Diagnosis not present

## 2017-12-20 DIAGNOSIS — L03317 Cellulitis of buttock: Secondary | ICD-10-CM | POA: Diagnosis not present

## 2017-12-20 DIAGNOSIS — B078 Other viral warts: Secondary | ICD-10-CM | POA: Diagnosis not present

## 2018-02-10 DIAGNOSIS — Z6833 Body mass index (BMI) 33.0-33.9, adult: Secondary | ICD-10-CM | POA: Diagnosis not present

## 2018-02-10 DIAGNOSIS — R509 Fever, unspecified: Secondary | ICD-10-CM | POA: Diagnosis not present

## 2018-02-10 DIAGNOSIS — R05 Cough: Secondary | ICD-10-CM | POA: Diagnosis not present

## 2018-02-10 DIAGNOSIS — J069 Acute upper respiratory infection, unspecified: Secondary | ICD-10-CM | POA: Diagnosis not present

## 2018-02-10 DIAGNOSIS — J029 Acute pharyngitis, unspecified: Secondary | ICD-10-CM | POA: Diagnosis not present

## 2018-03-06 DIAGNOSIS — H2512 Age-related nuclear cataract, left eye: Secondary | ICD-10-CM | POA: Diagnosis not present

## 2018-03-08 DIAGNOSIS — H5213 Myopia, bilateral: Secondary | ICD-10-CM | POA: Diagnosis not present

## 2018-03-08 DIAGNOSIS — H25042 Posterior subcapsular polar age-related cataract, left eye: Secondary | ICD-10-CM | POA: Diagnosis not present

## 2018-03-08 DIAGNOSIS — Z961 Presence of intraocular lens: Secondary | ICD-10-CM | POA: Diagnosis not present

## 2018-05-16 DIAGNOSIS — H2512 Age-related nuclear cataract, left eye: Secondary | ICD-10-CM | POA: Diagnosis not present

## 2018-05-16 DIAGNOSIS — H25812 Combined forms of age-related cataract, left eye: Secondary | ICD-10-CM | POA: Diagnosis not present

## 2018-05-31 DIAGNOSIS — Z20828 Contact with and (suspected) exposure to other viral communicable diseases: Secondary | ICD-10-CM | POA: Diagnosis not present

## 2018-06-14 DIAGNOSIS — E7800 Pure hypercholesterolemia, unspecified: Secondary | ICD-10-CM | POA: Diagnosis not present

## 2018-06-14 DIAGNOSIS — I10 Essential (primary) hypertension: Secondary | ICD-10-CM | POA: Diagnosis not present

## 2018-06-14 DIAGNOSIS — R82998 Other abnormal findings in urine: Secondary | ICD-10-CM | POA: Diagnosis not present

## 2018-06-14 DIAGNOSIS — Z125 Encounter for screening for malignant neoplasm of prostate: Secondary | ICD-10-CM | POA: Diagnosis not present

## 2018-06-14 DIAGNOSIS — E039 Hypothyroidism, unspecified: Secondary | ICD-10-CM | POA: Diagnosis not present

## 2018-06-21 DIAGNOSIS — D692 Other nonthrombocytopenic purpura: Secondary | ICD-10-CM | POA: Diagnosis not present

## 2018-06-21 DIAGNOSIS — I252 Old myocardial infarction: Secondary | ICD-10-CM | POA: Diagnosis not present

## 2018-06-21 DIAGNOSIS — N200 Calculus of kidney: Secondary | ICD-10-CM | POA: Diagnosis not present

## 2018-06-21 DIAGNOSIS — Z0000 Encounter for general adult medical examination without abnormal findings: Secondary | ICD-10-CM | POA: Diagnosis not present

## 2018-06-21 DIAGNOSIS — Z1331 Encounter for screening for depression: Secondary | ICD-10-CM | POA: Diagnosis not present

## 2018-06-21 DIAGNOSIS — G4700 Insomnia, unspecified: Secondary | ICD-10-CM | POA: Diagnosis not present

## 2018-06-21 DIAGNOSIS — D126 Benign neoplasm of colon, unspecified: Secondary | ICD-10-CM | POA: Diagnosis not present

## 2018-06-21 DIAGNOSIS — I2510 Atherosclerotic heart disease of native coronary artery without angina pectoris: Secondary | ICD-10-CM | POA: Diagnosis not present

## 2018-06-21 DIAGNOSIS — Z1339 Encounter for screening examination for other mental health and behavioral disorders: Secondary | ICD-10-CM | POA: Diagnosis not present

## 2018-06-21 DIAGNOSIS — E7800 Pure hypercholesterolemia, unspecified: Secondary | ICD-10-CM | POA: Diagnosis not present

## 2018-06-21 DIAGNOSIS — E039 Hypothyroidism, unspecified: Secondary | ICD-10-CM | POA: Diagnosis not present

## 2018-06-21 DIAGNOSIS — E669 Obesity, unspecified: Secondary | ICD-10-CM | POA: Diagnosis not present

## 2018-06-21 DIAGNOSIS — I119 Hypertensive heart disease without heart failure: Secondary | ICD-10-CM | POA: Diagnosis not present

## 2018-06-21 DIAGNOSIS — R7309 Other abnormal glucose: Secondary | ICD-10-CM | POA: Diagnosis not present

## 2018-09-13 DIAGNOSIS — Z23 Encounter for immunization: Secondary | ICD-10-CM | POA: Diagnosis not present

## 2018-10-17 DIAGNOSIS — B078 Other viral warts: Secondary | ICD-10-CM | POA: Diagnosis not present

## 2018-10-17 DIAGNOSIS — L820 Inflamed seborrheic keratosis: Secondary | ICD-10-CM | POA: Diagnosis not present

## 2019-01-22 DIAGNOSIS — M5442 Lumbago with sciatica, left side: Secondary | ICD-10-CM | POA: Diagnosis not present

## 2019-01-22 DIAGNOSIS — M9903 Segmental and somatic dysfunction of lumbar region: Secondary | ICD-10-CM | POA: Diagnosis not present

## 2019-01-23 DIAGNOSIS — M9903 Segmental and somatic dysfunction of lumbar region: Secondary | ICD-10-CM | POA: Diagnosis not present

## 2019-01-23 DIAGNOSIS — M5442 Lumbago with sciatica, left side: Secondary | ICD-10-CM | POA: Diagnosis not present

## 2019-01-24 DIAGNOSIS — M9903 Segmental and somatic dysfunction of lumbar region: Secondary | ICD-10-CM | POA: Diagnosis not present

## 2019-01-24 DIAGNOSIS — M5442 Lumbago with sciatica, left side: Secondary | ICD-10-CM | POA: Diagnosis not present

## 2019-01-29 DIAGNOSIS — M9903 Segmental and somatic dysfunction of lumbar region: Secondary | ICD-10-CM | POA: Diagnosis not present

## 2019-01-29 DIAGNOSIS — M5442 Lumbago with sciatica, left side: Secondary | ICD-10-CM | POA: Diagnosis not present

## 2019-01-30 DIAGNOSIS — M9903 Segmental and somatic dysfunction of lumbar region: Secondary | ICD-10-CM | POA: Diagnosis not present

## 2019-01-30 DIAGNOSIS — M5442 Lumbago with sciatica, left side: Secondary | ICD-10-CM | POA: Diagnosis not present

## 2019-01-31 DIAGNOSIS — M5442 Lumbago with sciatica, left side: Secondary | ICD-10-CM | POA: Diagnosis not present

## 2019-01-31 DIAGNOSIS — M9903 Segmental and somatic dysfunction of lumbar region: Secondary | ICD-10-CM | POA: Diagnosis not present

## 2019-02-02 ENCOUNTER — Ambulatory Visit: Payer: PRIVATE HEALTH INSURANCE

## 2019-02-02 DIAGNOSIS — Z23 Encounter for immunization: Secondary | ICD-10-CM | POA: Diagnosis not present

## 2019-02-10 ENCOUNTER — Ambulatory Visit: Payer: PRIVATE HEALTH INSURANCE

## 2019-02-23 ENCOUNTER — Ambulatory Visit: Payer: PRIVATE HEALTH INSURANCE

## 2019-03-02 DIAGNOSIS — Z23 Encounter for immunization: Secondary | ICD-10-CM | POA: Diagnosis not present

## 2019-05-23 DIAGNOSIS — M1711 Unilateral primary osteoarthritis, right knee: Secondary | ICD-10-CM | POA: Diagnosis not present

## 2019-05-23 DIAGNOSIS — M25561 Pain in right knee: Secondary | ICD-10-CM | POA: Diagnosis not present

## 2019-06-06 DIAGNOSIS — B078 Other viral warts: Secondary | ICD-10-CM | POA: Diagnosis not present

## 2019-06-15 DIAGNOSIS — E7800 Pure hypercholesterolemia, unspecified: Secondary | ICD-10-CM | POA: Diagnosis not present

## 2019-06-15 DIAGNOSIS — Z961 Presence of intraocular lens: Secondary | ICD-10-CM | POA: Diagnosis not present

## 2019-06-15 DIAGNOSIS — E038 Other specified hypothyroidism: Secondary | ICD-10-CM | POA: Diagnosis not present

## 2019-06-15 DIAGNOSIS — Z125 Encounter for screening for malignant neoplasm of prostate: Secondary | ICD-10-CM | POA: Diagnosis not present

## 2019-06-15 DIAGNOSIS — H40051 Ocular hypertension, right eye: Secondary | ICD-10-CM | POA: Diagnosis not present

## 2019-08-21 DIAGNOSIS — M1711 Unilateral primary osteoarthritis, right knee: Secondary | ICD-10-CM | POA: Diagnosis not present

## 2019-08-21 DIAGNOSIS — M25561 Pain in right knee: Secondary | ICD-10-CM | POA: Diagnosis not present

## 2019-09-19 ENCOUNTER — Encounter (HOSPITAL_COMMUNITY): Payer: PRIVATE HEALTH INSURANCE

## 2019-09-28 ENCOUNTER — Inpatient Hospital Stay: Admit: 2019-09-28 | Payer: PRIVATE HEALTH INSURANCE | Admitting: Orthopedic Surgery

## 2019-09-28 SURGERY — ARTHROPLASTY, KNEE, TOTAL
Anesthesia: Spinal | Site: Knee | Laterality: Right

## 2019-10-25 NOTE — Progress Notes (Signed)
Pt. Needs orders for upcomming surgery.PST appointment and labs on 10/26/19.Thanks.

## 2019-10-25 NOTE — Patient Instructions (Addendum)
DUE TO COVID-19 ONLY ONE VISITOR IS ALLOWED TO COME WITH YOU AND STAY IN THE WAITING ROOM ONLY DURING PRE OP AND PROCEDURE DAY OF SURGERY. THE 1 VISITOR  MAY VISIT WITH YOU AFTER SURGERY IN YOUR PRIVATE ROOM DURING VISITING HOURS ONLY!  YOU NEED TO HAVE A COVID 19 TEST ON: 10/30/19 @ 9:00 AM, THIS TEST MUST BE DONE BEFORE SURGERY,  COVID TESTING SITE [place] [address], IT IS ON THE RIGHT GOING OUT [address] APPROXIMATELY  2 MINUTES PAST [employer] ON THE RIGHT. ONCE YOUR COVID TEST IS COMPLETED,  PLEASE BEGIN THE QUARANTINE INSTRUCTIONS AS OUTLINED IN YOUR HANDOUT.                Andrew Greer   Your procedure is scheduled on: 11/02/19   Report to [facility] Main  Entrance   Report to admitting at: 10:00 AM     Call this number if you have problems the morning of surgery [phone]    Remember: Do not eat solid food :After Midnight. Clear liquids from midnight until: 9:30 am.  CLEAR LIQUID DIET   Foods Allowed                                                                     Foods Excluded  Coffee and tea, regular and decaf                             liquids that you cannot  Plain Jell-O any favor except red or purple                                           see through such as: Fruit ices (not with fruit pulp)                                     milk, soups, orange juice  Iced Popsicles                                    All solid food Carbonated beverages, regular and diet                                    Cranberry, grape and apple juices Sports drinks like Gatorade Lightly seasoned clear broth or consume(fat free) Sugar, honey syrup  Sample Menu Breakfast                                Lunch                                     Supper Cranberry juice  Beef broth                            Chicken broth Jell-O                                     Grape juice                           Apple juice Coffee  or tea                        Jell-O                                      Popsicle                                                Coffee or tea                        Coffee or tea  _____________________________________________________________________  BRUSH YOUR TEETH MORNING OF SURGERY AND RINSE YOUR MOUTH OUT, NO CHEWING GUM CANDY OR MINTS.     Take these medicines the morning of surgery with A SIP OF WATER:fenofibrate,synthroid.                                You may not have any metal on your body including hair pins and              piercings  Do not wear jewelry, lotions, powders or perfumes, deodorant             Men may shave face and neck.   Do not bring valuables to the hospital. [place].  Contacts, dentures or bridgework may not be worn into surgery.  Leave suitcase in the car. After surgery it may be brought to your room.     Patients discharged the day of surgery will not be allowed to drive home. IF YOU ARE HAVING SURGERY AND GOING HOME THE SAME DAY, YOU MUST HAVE AN ADULT TO DRIVE YOU HOME AND BE WITH YOU FOR 24 HOURS. YOU MAY GO HOME BY TAXI OR UBER OR ORTHERWISE, BUT AN ADULT MUST ACCOMPANY YOU HOME AND STAY WITH YOU FOR 24 HOURS.  Name and phone number of your driver:  Special Instructions: N/A              Please read over the following fact sheets you were given: _____________________________________________________________________         [facility] - Preparing for Surgery Before surgery, you can play an important role.  Because skin is not sterile, your skin needs to be as free of germs as possible.  You can reduce the number of germs on your skin by washing with CHG (chlorahexidine gluconate) soap before surgery.  CHG is an antiseptic cleaner which kills germs and bonds with the skin to continue killing germs even  after washing. Please DO NOT use if you have an allergy to CHG or antibacterial soaps.  If your skin  becomes reddened/irritated stop using the CHG and inform your nurse when you arrive at Short Stay. Do not shave (including legs and underarms) for at least 48 hours prior to the first CHG shower.  You may shave your face/neck. Please follow these instructions carefully:  1.  Shower with CHG Soap the night before surgery and the  morning of Surgery.  2.  If you choose to wash your hair, wash your hair first as usual with your  normal  shampoo.  3.  After you shampoo, rinse your hair and body thoroughly to remove the  shampoo.                           4.  Use CHG as you would any other liquid soap.  You can apply chg directly  to the skin and wash                       Gently with a scrungie or clean washcloth.  5.  Apply the CHG Soap to your body ONLY FROM THE NECK DOWN.   Do not use on face/ open                           Wound or open sores. Avoid contact with eyes, ears mouth and genitals (private parts).                       Wash face,  Genitals (private parts) with your normal soap.             6.  Wash thoroughly, paying special attention to the area where your surgery  will be performed.  7.  Thoroughly rinse your body with warm water from the neck down.  8.  DO NOT shower/wash with your normal soap after using and rinsing off  the CHG Soap.                9.  Pat yourself dry with a clean towel.            10.  Wear clean pajamas.            11.  Place clean sheets on your bed the night of your first shower and do not  sleep with pets. Day of Surgery : Do not apply any lotions/deodorants the morning of surgery.  Please wear clean clothes to the hospital/surgery center.  FAILURE TO FOLLOW THESE INSTRUCTIONS MAY RESULT IN THE CANCELLATION OF YOUR SURGERY PATIENT SIGNATURE_________________________________  NURSE SIGNATURE__________________________________  ________________________________________________________________________

## 2019-10-26 ENCOUNTER — Encounter (HOSPITAL_COMMUNITY): Payer: Self-pay

## 2019-10-26 ENCOUNTER — Encounter (HOSPITAL_COMMUNITY)
Admission: RE | Admit: 2019-10-26 | Discharge: 2019-10-26 | Disposition: A | Discharge status: Home / Self Care | Payer: Medicare Other | Source: Ambulatory Visit | Attending: Orthopedic Surgery | Admitting: Orthopedic Surgery

## 2019-10-26 ENCOUNTER — Other Ambulatory Visit: Payer: Self-pay

## 2019-10-26 DIAGNOSIS — Z01818 Encounter for other preprocedural examination: Secondary | ICD-10-CM | POA: Diagnosis not present

## 2019-10-26 HISTORY — DX: Unspecified osteoarthritis, unspecified site: M19.90

## 2019-10-26 LAB — BASIC METABOLIC PANEL
Anion gap: 11 (ref 5–15)
BUN: 16 mg/dL (ref 8–23)
CO2: 26 mmol/L (ref 22–32)
Calcium: 9.6 mg/dL (ref 8.9–10.3)
Chloride: 101 mmol/L (ref 98–111)
Creatinine, Ser: 1.1 mg/dL (ref 0.61–1.24)
GFR, Estimated: >60 mL/min (ref >60)
Glucose, Bld: 162 mg/dL — ABNORMAL HIGH (ref 70–99)
Potassium: 4.1 mmol/L (ref 3.5–5.1)
Sodium: 138 mmol/L (ref 135–145)

## 2019-10-26 LAB — CBC
HCT: 48.6 % (ref 39.0–52.0)
Hemoglobin: 16.8 g/dL (ref 13.0–17.0)
MCH: 33.9 pg (ref 26.0–34.0)
MCHC: 34.6 g/dL (ref 30.0–36.0)
MCV: 98 fL (ref 80.0–100.0)
Platelets: 254 10*3/uL (ref 150–400)
RBC: 4.96 MIL/uL (ref 4.22–5.81)
RDW: 12.9 % (ref 11.5–15.5)
WBC: 5.9 10*3/uL (ref 4.0–10.5)
nRBC: 0 % (ref 0.0–0.2)

## 2019-10-26 LAB — SURGICAL PCR SCREEN
MRSA, PCR: NEGATIVE
Staphylococcus aureus: NEGATIVE

## 2019-10-26 NOTE — Progress Notes (Addendum)
COVID Vaccine Completed: Yes Date COVID Vaccine completed: 03/02/19 COVID vaccine manufacturer:   Moderna    PCP - Dr. Domenick Gong Cardiologist - NO  Chest x-ray -  EKG -  Stress Test -  ECHO -  Cardiac Cath -  Pacemaker/ICD device last checked:  Sleep Study -  CPAP -   Fasting Blood Sugar -  Checks Blood Sugar _____ times a day  Blood Thinner Instructions: Aspirin Instructions: No instructions yet. Last Dose:  Anesthesia review:   Patient denies shortness of breath, fever, cough and chest pain at PAT appointment   Patient verbalized understanding of instructions that were given to them at the PAT appointment. Patient was also instructed that they will need to review over the PAT instructions again at home before surgery.

## 2019-10-30 ENCOUNTER — Other Ambulatory Visit (HOSPITAL_COMMUNITY)
Admission: RE | Admit: 2019-10-30 | Discharge: 2019-10-30 | Disposition: A | Discharge status: Home / Self Care | Payer: Medicare Other | Source: Ambulatory Visit | Attending: Orthopedic Surgery | Admitting: Orthopedic Surgery

## 2019-10-30 DIAGNOSIS — Z01812 Encounter for preprocedural laboratory examination: Secondary | ICD-10-CM | POA: Diagnosis not present

## 2019-10-30 DIAGNOSIS — Z20822 Contact with and (suspected) exposure to covid-19: Secondary | ICD-10-CM | POA: Diagnosis not present

## 2019-10-30 LAB — SARS CORONAVIRUS 2 (TAT 6-24 HRS): SARS Coronavirus 2: NEGATIVE

## 2019-10-30 NOTE — H&P (Signed)
Patient's anticipated LOS is less than 2 midnights, meeting these requirements: - Younger than 70 - Lives within 1 hour of care - Has a competent adult at home to recover with post-op recover - NO history of  - Chronic pain requiring opiods  - Diabetes  - Coronary Artery Disease  - Heart failure  - Heart attack  - Stroke  - DVT/VTE  - Cardiac arrhythmia  - Respiratory Failure/COPD  - Renal failure  - Anemia  - Advanced Liver disease       Andrew Greer is an 69 y.o. male.    Chief Complaint: right knee pain  HPI: Pt is a 69 y.o. male complaining of right knee pain for multiple years. Pain had continually increased since the beginning. X-rays in the clinic show end-stage arthritic changes of the right knee. Pt has tried various conservative treatments which have failed to alleviate their symptoms, including injections and therapy. Various options are discussed with the patient. Risks, benefits and expectations were discussed with the patient. Patient understand the risks, benefits and expectations and wishes to proceed with surgery.   PCP:  Haywood Pao, MD  D/C Plans: Home  PMH: Past Medical History:  Diagnosis Date  . Arthritis   . Colon polyps   . Essential hypertension, benign   . Hypothyroidism   . MI (myocardial infarction) ([place])   . Other and unspecified hyperlipidemia     PSH: Past Surgical History:  Procedure Laterality Date  . AMPUTATION FINGER / THUMB  1978  . ARTHROSCOPIC HAGLUNDS REPAIR  2000  . cardiac stents      Social History:  reports that he quit smoking about 44 years ago. His smoking use included cigarettes. He has never used smokeless tobacco. He reports current alcohol use of about 3.0 - 4.0 standard drinks of alcohol per week. He reports that he does not use drugs.  Allergies:  No Known Allergies  Medications: No current facility-administered medications for this encounter.   Current Outpatient Medications  Medication Sig  Dispense Refill  . acetaminophen (TYLENOL) 500 MG tablet Take 1,000 mg by mouth every 6 (six) hours as needed for moderate pain or headache.    Marland Kitchen aspirin EC 81 MG tablet Take 81 mg by mouth daily.    . fenofibrate 160 MG tablet Take 160 mg by mouth daily.    Marland Kitchen HYDROcodone-acetaminophen (NORCO/VICODIN) 5-325 MG tablet Take 1 tablet by mouth 2 (two) times daily as needed for moderate pain.    Marland Kitchen levothyroxine (SYNTHROID) 125 MCG tablet Take 125 mcg by mouth daily before breakfast.    . ramipril (ALTACE) 10 MG capsule Take 10 mg by mouth daily.    . rosuvastatin (CRESTOR) 20 MG tablet Take 20 mg by mouth daily.      No results found for this or any previous visit (from the past 48 hour(s)). No results found.  ROS: Pain with rom of the right lower extremity  Physical Exam: Alert and oriented 69 y.o. male in no acute distress Cranial nerves 2-12 intact Cervical spine: full rom with no tenderness, nv intact distally Chest: active breath sounds bilaterally, no wheeze rhonchi or rales Heart: regular rate and rhythm, no murmur Abd: non tender non distended with active bowel sounds Hip is stable with rom  Right knee painful rom Antalgic gait No rashes or edema distally  Assessment/Plan Assessment: right knee end stage osteoarthritis  Plan:  Patient will undergo a right total knee by Dr. Veverly Fells at [facility] Risks benefits  and expectations were discussed with the patient. Patient understand risks, benefits and expectations and wishes to proceed. Preoperative templating of the joint replacement has been completed, documented, and submitted to the Operating Room personnel in order to optimize intra-operative equipment management.   Merla Riches PA-C, MPAS [facility] is now [employer] [address], [place], [place], [place] [address] Phone: [phone] [email or web address] Facebook  [employer]

## 2019-11-02 ENCOUNTER — Observation Stay (HOSPITAL_COMMUNITY): Payer: Medicare Other

## 2019-11-02 ENCOUNTER — Other Ambulatory Visit: Payer: Self-pay

## 2019-11-02 ENCOUNTER — Observation Stay (HOSPITAL_COMMUNITY)
Admission: RE | Admit: 2019-11-02 | Discharge: 2019-11-03 | Disposition: A | Discharge status: Home / Self Care | Payer: Medicare Other | Source: Ambulatory Visit | Attending: Orthopedic Surgery | Admitting: Orthopedic Surgery

## 2019-11-02 ENCOUNTER — Encounter (HOSPITAL_COMMUNITY): Payer: Self-pay | Admitting: Orthopedic Surgery

## 2019-11-02 ENCOUNTER — Other Ambulatory Visit (HOSPITAL_COMMUNITY): Payer: Self-pay | Admitting: Orthopedic Surgery

## 2019-11-02 ENCOUNTER — Encounter (HOSPITAL_COMMUNITY)
Admission: RE | Disposition: A | Discharge status: Home / Self Care | Payer: Self-pay | Source: Ambulatory Visit | Attending: Orthopedic Surgery

## 2019-11-02 ENCOUNTER — Inpatient Hospital Stay (HOSPITAL_COMMUNITY): Payer: Medicare Other | Admitting: Certified Registered Nurse Anesthetist

## 2019-11-02 DIAGNOSIS — I252 Old myocardial infarction: Secondary | ICD-10-CM | POA: Diagnosis not present

## 2019-11-02 DIAGNOSIS — Z87891 Personal history of nicotine dependence: Secondary | ICD-10-CM | POA: Insufficient documentation

## 2019-11-02 DIAGNOSIS — I10 Essential (primary) hypertension: Secondary | ICD-10-CM | POA: Diagnosis not present

## 2019-11-02 DIAGNOSIS — Z79899 Other long term (current) drug therapy: Secondary | ICD-10-CM | POA: Diagnosis not present

## 2019-11-02 DIAGNOSIS — Z96651 Presence of right artificial knee joint: Secondary | ICD-10-CM | POA: Diagnosis not present

## 2019-11-02 DIAGNOSIS — Z7982 Long term (current) use of aspirin: Secondary | ICD-10-CM | POA: Diagnosis not present

## 2019-11-02 DIAGNOSIS — E039 Hypothyroidism, unspecified: Secondary | ICD-10-CM | POA: Insufficient documentation

## 2019-11-02 DIAGNOSIS — M1711 Unilateral primary osteoarthritis, right knee: Secondary | ICD-10-CM | POA: Diagnosis not present

## 2019-11-02 DIAGNOSIS — G8918 Other acute postprocedural pain: Secondary | ICD-10-CM | POA: Diagnosis not present

## 2019-11-02 DIAGNOSIS — Z471 Aftercare following joint replacement surgery: Secondary | ICD-10-CM | POA: Diagnosis not present

## 2019-11-02 DIAGNOSIS — M25561 Pain in right knee: Secondary | ICD-10-CM | POA: Diagnosis present

## 2019-11-02 HISTORY — PX: TOTAL KNEE ARTHROPLASTY: SHX125

## 2019-11-02 SURGERY — ARTHROPLASTY, KNEE, TOTAL
Anesthesia: Monitor Anesthesia Care | Site: Knee | Laterality: Right

## 2019-11-02 MED ORDER — FENOFIBRATE 160 MG PO TABS
160.0000 mg | ORAL_TABLET | Freq: Every day | ORAL | Status: DC
  Administered 2019-11-02 – 2019-11-03 (×2): 160 mg via ORAL
  Filled 2019-11-02 (×2): qty 1

## 2019-11-02 MED ORDER — BUPIVACAINE HCL 0.25 % IJ SOLN
INTRAMUSCULAR | Status: AC
  Filled 2019-11-02: qty 1

## 2019-11-02 MED ORDER — METHOCARBAMOL 500 MG IVPB - SIMPLE MED
500.0000 mg | Freq: Four times a day (QID) | INTRAVENOUS | Status: DC | PRN
  Administered 2019-11-02: 500 mg via INTRAVENOUS
  Filled 2019-11-02: qty 50

## 2019-11-02 MED ORDER — SODIUM CHLORIDE 0.9 % IV SOLN: INTRAVENOUS | Status: DC

## 2019-11-02 MED ORDER — ONDANSETRON HCL 4 MG PO TABS: 4.0000 mg | ORAL_TABLET | Freq: Three times a day (TID) | ORAL | 1 refills | Status: DC | PRN

## 2019-11-02 MED ORDER — FENTANYL CITRATE (PF) 100 MCG/2ML IJ SOLN
50.0000 ug | Freq: Once | INTRAMUSCULAR | Status: AC
  Administered 2019-11-02: 50 ug via INTRAVENOUS
  Filled 2019-11-02: qty 2

## 2019-11-02 MED ORDER — ROSUVASTATIN CALCIUM 20 MG PO TABS
20.0000 mg | ORAL_TABLET | Freq: Every day | ORAL | Status: DC
  Administered 2019-11-02 – 2019-11-03 (×2): 20 mg via ORAL
  Filled 2019-11-02 (×2): qty 1

## 2019-11-02 MED ORDER — FERROUS SULFATE 325 (65 FE) MG PO TABS
325.0000 mg | ORAL_TABLET | Freq: Three times a day (TID) | ORAL | Status: DC
  Administered 2019-11-02 – 2019-11-03 (×3): 325 mg via ORAL
  Filled 2019-11-02 (×3): qty 1

## 2019-11-02 MED ORDER — BUPIVACAINE HCL 0.25 % IJ SOLN
INTRAMUSCULAR | Status: DC | PRN
  Administered 2019-11-02: 30 mL

## 2019-11-02 MED ORDER — BUPIVACAINE IN DEXTROSE 0.75-8.25 % IT SOLN
INTRATHECAL | Status: DC | PRN
  Administered 2019-11-02: 2 mL via INTRATHECAL

## 2019-11-02 MED ORDER — CHLORHEXIDINE GLUCONATE 0.12 % MT SOLN
15.0000 mL | Freq: Once | OROMUCOSAL | Status: AC
  Administered 2019-11-02: 15 mL via OROMUCOSAL

## 2019-11-02 MED ORDER — MIDAZOLAM HCL 2 MG/2ML IJ SOLN
1.0000 mg | Freq: Once | INTRAMUSCULAR | Status: AC
  Administered 2019-11-02: 2 mg via INTRAVENOUS
  Filled 2019-11-02: qty 2

## 2019-11-02 MED ORDER — ACETAMINOPHEN 325 MG PO TABS: 325.0000 mg | ORAL_TABLET | Freq: Four times a day (QID) | ORAL | Status: DC | PRN

## 2019-11-02 MED ORDER — SODIUM CHLORIDE 0.9 % IR SOLN
Status: DC | PRN
  Administered 2019-11-02: 1000 mL

## 2019-11-02 MED ORDER — ONDANSETRON HCL 4 MG/2ML IJ SOLN
INTRAMUSCULAR | Status: AC
  Filled 2019-11-02: qty 2

## 2019-11-02 MED ORDER — CEFAZOLIN SODIUM-DEXTROSE 2-4 GM/100ML-% IV SOLN
2.0000 g | Freq: Four times a day (QID) | INTRAVENOUS | Status: AC
  Administered 2019-11-02 – 2019-11-03 (×2): 2 g via INTRAVENOUS
  Filled 2019-11-02 (×2): qty 100

## 2019-11-02 MED ORDER — ONDANSETRON HCL 4 MG PO TABS: 4.0000 mg | ORAL_TABLET | Freq: Four times a day (QID) | ORAL | Status: DC | PRN

## 2019-11-02 MED ORDER — TRANEXAMIC ACID-NACL 1000-0.7 MG/100ML-% IV SOLN
1000.0000 mg | INTRAVENOUS | Status: AC
  Administered 2019-11-02: 1000 mg via INTRAVENOUS
  Filled 2019-11-02: qty 100

## 2019-11-02 MED ORDER — SODIUM CHLORIDE (PF) 0.9 % IJ SOLN
INTRAMUSCULAR | Status: AC
  Filled 2019-11-02: qty 50

## 2019-11-02 MED ORDER — OXYCODONE HCL 5 MG PO TABS
5.0000 mg | ORAL_TABLET | ORAL | Status: DC | PRN
  Administered 2019-11-02 – 2019-11-03 (×3): 10 mg via ORAL
  Filled 2019-11-02 (×3): qty 2

## 2019-11-02 MED ORDER — PHENOL 1.4 % MT LIQD: 1.0000 | OROMUCOSAL | Status: DC | PRN

## 2019-11-02 MED ORDER — ACETAMINOPHEN 10 MG/ML IV SOLN: 1000.0000 mg | Freq: Once | INTRAVENOUS | Status: DC | PRN

## 2019-11-02 MED ORDER — HYDROMORPHONE HCL 1 MG/ML IJ SOLN
0.2500 mg | INTRAMUSCULAR | Status: DC | PRN
  Administered 2019-11-02: 0.25 mg via INTRAVENOUS
  Administered 2019-11-02: 0.5 mg via INTRAVENOUS

## 2019-11-02 MED ORDER — METHOCARBAMOL 500 MG PO TABS: 500.0000 mg | ORAL_TABLET | Freq: Four times a day (QID) | ORAL | 1 refills | Status: DC | PRN

## 2019-11-02 MED ORDER — METOCLOPRAMIDE HCL 5 MG/ML IJ SOLN: 5.0000 mg | Freq: Three times a day (TID) | INTRAMUSCULAR | Status: DC | PRN

## 2019-11-02 MED ORDER — PROPOFOL 10 MG/ML IV BOLUS
INTRAVENOUS | Status: DC | PRN
  Administered 2019-11-02: 30 mg via INTRAVENOUS

## 2019-11-02 MED ORDER — TRANEXAMIC ACID-NACL 1000-0.7 MG/100ML-% IV SOLN
1000.0000 mg | Freq: Once | INTRAVENOUS | Status: AC
  Administered 2019-11-02: 1000 mg via INTRAVENOUS
  Filled 2019-11-02: qty 100

## 2019-11-02 MED ORDER — DEXAMETHASONE SODIUM PHOSPHATE 10 MG/ML IJ SOLN
INTRAMUSCULAR | Status: DC | PRN
  Administered 2019-11-02: 4 mg via INTRAVENOUS

## 2019-11-02 MED ORDER — METOCLOPRAMIDE HCL 5 MG PO TABS: 5.0000 mg | ORAL_TABLET | Freq: Three times a day (TID) | ORAL | Status: DC | PRN

## 2019-11-02 MED ORDER — FENTANYL CITRATE (PF) 100 MCG/2ML IJ SOLN
INTRAMUSCULAR | Status: DC | PRN
  Administered 2019-11-02: 50 ug via INTRAVENOUS

## 2019-11-02 MED ORDER — DEXAMETHASONE SODIUM PHOSPHATE 10 MG/ML IJ SOLN
INTRAMUSCULAR | Status: DC | PRN
  Administered 2019-11-02: 5 mg

## 2019-11-02 MED ORDER — MENTHOL 3 MG MT LOZG: 1.0000 | LOZENGE | OROMUCOSAL | Status: DC | PRN

## 2019-11-02 MED ORDER — CEFAZOLIN SODIUM-DEXTROSE 2-4 GM/100ML-% IV SOLN
2.0000 g | INTRAVENOUS | Status: AC
  Administered 2019-11-02: 2 g via INTRAVENOUS
  Filled 2019-11-02: qty 100

## 2019-11-02 MED ORDER — DEXAMETHASONE SODIUM PHOSPHATE 10 MG/ML IJ SOLN
INTRAMUSCULAR | Status: AC
  Filled 2019-11-02: qty 1

## 2019-11-02 MED ORDER — ONDANSETRON HCL 4 MG/2ML IJ SOLN: 4.0000 mg | Freq: Four times a day (QID) | INTRAMUSCULAR | Status: DC | PRN

## 2019-11-02 MED ORDER — ASPIRIN EC 81 MG PO TBEC
81.0000 mg | DELAYED_RELEASE_TABLET | Freq: Two times a day (BID) | ORAL | Status: DC
  Administered 2019-11-02 – 2019-11-03 (×2): 81 mg via ORAL
  Filled 2019-11-02 (×2): qty 1

## 2019-11-02 MED ORDER — PROPOFOL 500 MG/50ML IV EMUL
INTRAVENOUS | Status: DC | PRN
  Administered 2019-11-02: 75 ug/kg/min via INTRAVENOUS

## 2019-11-02 MED ORDER — ONDANSETRON HCL 4 MG/2ML IJ SOLN: 4.0000 mg | Freq: Once | INTRAMUSCULAR | Status: DC | PRN

## 2019-11-02 MED ORDER — SODIUM CHLORIDE (PF) 0.9 % IJ SOLN
INTRAMUSCULAR | Status: DC | PRN
  Administered 2019-11-02: 30 mL

## 2019-11-02 MED ORDER — RAMIPRIL 10 MG PO CAPS
10.0000 mg | ORAL_CAPSULE | Freq: Every day | ORAL | Status: DC
  Administered 2019-11-02 – 2019-11-03 (×2): 10 mg via ORAL
  Filled 2019-11-02 (×2): qty 1

## 2019-11-02 MED ORDER — FENTANYL CITRATE (PF) 100 MCG/2ML IJ SOLN
INTRAMUSCULAR | Status: AC
  Filled 2019-11-02: qty 2

## 2019-11-02 MED ORDER — BISACODYL 10 MG RE SUPP: 10.0000 mg | Freq: Every day | RECTAL | Status: DC | PRN

## 2019-11-02 MED ORDER — ROPIVACAINE HCL 5 MG/ML IJ SOLN
INTRAMUSCULAR | Status: DC | PRN
  Administered 2019-11-02: 30 mL via PERINEURAL

## 2019-11-02 MED ORDER — LEVOTHYROXINE SODIUM 125 MCG PO TABS
125.0000 ug | ORAL_TABLET | Freq: Every day | ORAL | Status: DC
  Administered 2019-11-03: 125 ug via ORAL
  Filled 2019-11-02: qty 1

## 2019-11-02 MED ORDER — ONDANSETRON HCL 4 MG/2ML IJ SOLN
INTRAMUSCULAR | Status: DC | PRN
  Administered 2019-11-02: 4 mg via INTRAVENOUS

## 2019-11-02 MED ORDER — BUPIVACAINE LIPOSOME 1.3 % IJ SUSP
20.0000 mL | Freq: Once | INTRAMUSCULAR | Status: AC
  Administered 2019-11-02: 20 mL
  Filled 2019-11-02: qty 20

## 2019-11-02 MED ORDER — WATER FOR IRRIGATION, STERILE IR SOLN
Status: DC | PRN
  Administered 2019-11-02: 2000 mL

## 2019-11-02 MED ORDER — 0.9 % SODIUM CHLORIDE (POUR BTL) OPTIME
TOPICAL | Status: DC | PRN
  Administered 2019-11-02: 1000 mL

## 2019-11-02 MED ORDER — POLYETHYLENE GLYCOL 3350 17 G PO PACK: 17.0000 g | PACK | Freq: Every day | ORAL | Status: DC | PRN

## 2019-11-02 MED ORDER — PROPOFOL 10 MG/ML IV BOLUS
INTRAVENOUS | Status: AC
  Filled 2019-11-02: qty 40

## 2019-11-02 MED ORDER — HYDROMORPHONE HCL 1 MG/ML IJ SOLN
INTRAMUSCULAR | Status: AC
  Administered 2019-11-02: 0.25 mg via INTRAVENOUS
  Filled 2019-11-02: qty 1

## 2019-11-02 MED ORDER — ORAL CARE MOUTH RINSE: 15.0000 mL | Freq: Once | OROMUCOSAL | Status: AC

## 2019-11-02 MED ORDER — DOCUSATE SODIUM 100 MG PO CAPS
100.0000 mg | ORAL_CAPSULE | Freq: Two times a day (BID) | ORAL | Status: DC
  Administered 2019-11-02 – 2019-11-03 (×2): 100 mg via ORAL
  Filled 2019-11-02 (×2): qty 1

## 2019-11-02 MED ORDER — LACTATED RINGERS IV SOLN: INTRAVENOUS | Status: DC

## 2019-11-02 MED ORDER — ASPIRIN EC 81 MG PO TBEC: 81.0000 mg | DELAYED_RELEASE_TABLET | Freq: Two times a day (BID) | ORAL | 0 refills | Status: AC

## 2019-11-02 MED ORDER — ACETAMINOPHEN 500 MG PO TABS
1000.0000 mg | ORAL_TABLET | Freq: Four times a day (QID) | ORAL | Status: DC | PRN
  Administered 2019-11-03: 1000 mg via ORAL
  Filled 2019-11-02: qty 2

## 2019-11-02 MED ORDER — HYDROMORPHONE HCL 1 MG/ML IJ SOLN
0.5000 mg | INTRAMUSCULAR | Status: DC | PRN
  Administered 2019-11-02 – 2019-11-03 (×3): 1 mg via INTRAVENOUS
  Filled 2019-11-02 (×3): qty 1

## 2019-11-02 MED ORDER — METHOCARBAMOL 500 MG IVPB - SIMPLE MED
INTRAVENOUS | Status: AC
  Filled 2019-11-02: qty 50

## 2019-11-02 MED ORDER — METHOCARBAMOL 500 MG PO TABS
500.0000 mg | ORAL_TABLET | Freq: Four times a day (QID) | ORAL | Status: DC | PRN
  Administered 2019-11-03 (×3): 500 mg via ORAL
  Filled 2019-11-02 (×3): qty 1

## 2019-11-02 MED ORDER — OXYCODONE-ACETAMINOPHEN 7.5-325 MG PO TABS
1.0000 | ORAL_TABLET | ORAL | 0 refills | Status: DC | PRN
Start: 2019-11-02 — End: 2019-11-02

## 2019-11-02 MED FILL — OXYCODON-ACETAMINOPHEN 7.5-: 7.5-325 | 4 days supply | Qty: 20 | Fill #0

## 2019-11-02 MED FILL — METHOCARBAMOL 500 MG TABS: 500 | 15 days supply | Qty: 60 | Fill #0

## 2019-11-02 MED FILL — ONDANSETRON HCL 4 MG TABS: 4 | 10 days supply | Qty: 30 | Fill #0

## 2019-11-02 SURGICAL SUPPLY — 56 items
ATTUNE MED DOME PAT 41 KNEE (Knees) ×1 IMPLANT
ATTUNE PS FEM RT SZ9 CEM KNEE (Femur) ×1 IMPLANT
ATTUNE PS RP INSR KNEE SZ9 10 (Insert) ×1 IMPLANT
BAG SPEC THK2 15X12 ZIP CLS (MISCELLANEOUS)
BAG ZIPLOCK 12X15 (MISCELLANEOUS) IMPLANT
BASE TIBIAL ATTUNE KNEE SZ9 (Knees) IMPLANT
BLADE SAG 18X100X1.27 (BLADE) ×2 IMPLANT
BLADE SAW SGTL 13X75X1.27 (BLADE) ×2 IMPLANT
BNDG CMPR MED 10X6 ELC LF (GAUZE/BANDAGES/DRESSINGS) ×1
BNDG ELASTIC 6X10 VLCR STRL LF (GAUZE/BANDAGES/DRESSINGS) ×2 IMPLANT
BNDG GAUZE ELAST 4 BULKY (GAUZE/BANDAGES/DRESSINGS) ×2 IMPLANT
BOWL SMART MIX CTS (DISPOSABLE) ×2 IMPLANT
BSPLAT TIB 9 CMNT ROT PLAT STR (Knees) ×1 IMPLANT
CEMENT HV SMART SET (Cement) ×4 IMPLANT
COVER SURGICAL LIGHT HANDLE (MISCELLANEOUS) ×2 IMPLANT
COVER WAND RF STERILE (DRAPES) IMPLANT
CUFF TOURN SGL QUICK 34 (TOURNIQUET CUFF) ×2
CUFF TRNQT CYL 34X4.125X (TOURNIQUET CUFF) ×1 IMPLANT
DRAPE SHEET LG 3/4 BI-LAMINATE (DRAPES) ×2 IMPLANT
DRAPE U-SHAPE 47X51 STRL (DRAPES) ×2 IMPLANT
DRSG ADAPTIC 3X8 NADH LF (GAUZE/BANDAGES/DRESSINGS) ×2 IMPLANT
DRSG PAD ABDOMINAL 8X10 ST (GAUZE/BANDAGES/DRESSINGS) ×2 IMPLANT
DURAPREP 26ML APPLICATOR (WOUND CARE) ×2 IMPLANT
ELECT REM PT RETURN 15FT ADLT (MISCELLANEOUS) ×2 IMPLANT
GAUZE SPONGE 4X4 12PLY STRL (GAUZE/BANDAGES/DRESSINGS) ×2 IMPLANT
GLOVE BIOGEL PI ORTHO PRO 7.5 (GLOVE) ×1
GLOVE BIOGEL PI ORTHO PRO SZ8 (GLOVE) ×1
GLOVE ORTHO TXT STRL SZ7.5 (GLOVE) ×2 IMPLANT
GLOVE PI ORTHO PRO STRL 7.5 (GLOVE) ×1 IMPLANT
GLOVE PI ORTHO PRO STRL SZ8 (GLOVE) ×1 IMPLANT
GLOVE SURG ORTHO 8.5 STRL (GLOVE) ×4 IMPLANT
GOWN STRL REUS W/TWL XL LVL3 (GOWN DISPOSABLE) ×4 IMPLANT
HANDPIECE INTERPULSE COAX TIP (DISPOSABLE) ×2
HOLDER FOLEY CATH W/STRAP (MISCELLANEOUS) IMPLANT
IMMOBILIZER KNEE 20 (SOFTGOODS) ×2
IMMOBILIZER KNEE 20 THIGH 36 (SOFTGOODS) IMPLANT
KIT TURNOVER KIT A (KITS) IMPLANT
MANIFOLD NEPTUNE II (INSTRUMENTS) ×2 IMPLANT
NS IRRIG 1000ML POUR BTL (IV SOLUTION) ×2 IMPLANT
PACK TOTAL KNEE CUSTOM (KITS) ×2 IMPLANT
PENCIL SMOKE EVACUATOR (MISCELLANEOUS) IMPLANT
PIN DRILL FIX HALF THREAD (BIT) ×1 IMPLANT
PIN STEINMAN FIXATION KNEE (PIN) ×1 IMPLANT
PROTECTOR NERVE ULNAR (MISCELLANEOUS) ×2 IMPLANT
SET HNDPC FAN SPRY TIP SCT (DISPOSABLE) ×1 IMPLANT
STAPLER VISISTAT 35W (STAPLE) IMPLANT
STRIP CLOSURE SKIN 1/2X4 (GAUZE/BANDAGES/DRESSINGS) ×4 IMPLANT
SUT MNCRL AB 3-0 PS2 18 (SUTURE) ×2 IMPLANT
SUT VIC AB 0 CT1 36 (SUTURE) ×2 IMPLANT
SUT VIC AB 1 CT1 36 (SUTURE) ×6 IMPLANT
SUT VIC AB 2-0 CT1 27 (SUTURE) ×2
SUT VIC AB 2-0 CT1 TAPERPNT 27 (SUTURE) ×1 IMPLANT
TIBIAL BASE ATTUNE KNEE SZ9 (Knees) ×2 IMPLANT
TRAY FOLEY MTR SLVR 16FR STAT (SET/KITS/TRAYS/PACK) ×2 IMPLANT
WATER STERILE IRR 1000ML POUR (IV SOLUTION) ×4 IMPLANT
WRAP KNEE MAXI GEL POST OP (GAUZE/BANDAGES/DRESSINGS) ×1 IMPLANT

## 2019-11-02 NOTE — Brief Op Note (Signed)
11/02/2019  2:26 PM  PATIENT:  Andrew Greer  69 y.o. male  PRE-OPERATIVE DIAGNOSIS:  Right knee osteoarthritis, end stage  POST-OPERATIVE DIAGNOSIS:  Right knee osteoarthritis,end stage  PROCEDURE:  Procedure(s): TOTAL KNEE ARTHROPLASTY (Right) DePuy Attune  SURGEON:  Surgeon(s) and Role:    Netta Cedars, MD - Primary  PHYSICIAN ASSISTANT:   ASSISTANTS: [place] Bruns, PA-C   ANESTHESIA:  Adductor canal block and spinal  EBL:  minimal  BLOOD ADMINISTERED:none  DRAINS: none   LOCAL MEDICATIONS USED:  MARCAINE     SPECIMEN:  No Specimen  DISPOSITION OF SPECIMEN:  N/A  COUNTS:  YES  TOURNIQUET:   Total Tourniquet Time Documented: Thigh (Right) - 106 minutes Total: Thigh (Right) - 106 minutes   DICTATION: .Other Dictation: Dictation Number [id number]  PLAN OF CARE: Admit for overnight observation  PATIENT DISPOSITION:  PACU - hemodynamically stable.   Delay start of Pharmacological VTE agent (>24hrs) due to surgical blood loss or risk of bleeding: no

## 2019-11-02 NOTE — Progress Notes (Signed)
AssistedDr. Greg Stoltzfus with right, ultrasound guided, adductor canal block. Side rails up, monitors on throughout procedure. See vital signs in flow sheet. Tolerated Procedure well.  

## 2019-11-02 NOTE — Anesthesia Procedure Notes (Signed)
Anesthesia Regional Block: Adductor canal block   Pre-Anesthetic Checklist: ,, timeout performed, Correct Patient, Correct Site, Correct Laterality, Correct Procedure, Correct Position, site marked, Risks and benefits discussed,  Surgical consent,  Pre-op evaluation,  At surgeon's request and post-op pain management  Laterality: Right  Prep: Dura Prep       Needles:  Injection technique: Single-shot  Needle Type: Echogenic Stimulator Needle     Needle Length: 9cm  Needle Gauge: 20     Additional Needles:   Procedures:,,,, ultrasound used (permanent image in chart),,,,  Narrative:  Start time: 11/02/2019 10:48 AM End time: 11/02/2019 10:52 AM Injection made incrementally with aspirations every 5 mL.  Performed by: Personally  Anesthesiologist: Darral Dash, DO  Additional Notes: Patient identified. Risks/Benefits/Options discussed with patient including but not limited to bleeding, infection, nerve damage, failed block, incomplete pain control. Patient expressed understanding and wished to proceed. All questions were answered. Sterile technique was used throughout the entire procedure. Please see nursing notes for vital signs. Aspirated in 5cc intervals with injection for negative confirmation. Patient was given instructions on fall risk and not to get out of bed. All questions and concerns addressed with instructions to call with any issues or inadequate analgesia.

## 2019-11-02 NOTE — Anesthesia Procedure Notes (Addendum)
Spinal  Patient location during procedure: OR Start time: 11/02/2019 12:08 PM End time: 11/02/2019 12:13 PM Staffing Performed: resident/CRNA  Anesthesiologist: Darral Dash, DO Resident/CRNA: West Pugh, CRNA Preanesthetic Checklist Completed: patient identified, IV checked, site marked, risks and benefits discussed, surgical consent, monitors and equipment checked, pre-op evaluation and timeout performed Spinal Block Patient position: sitting Prep: DuraPrep Patient monitoring: heart rate, continuous pulse ox and blood pressure Approach: midline Location: L3-4 Injection technique: single-shot Needle Needle type: Pencan and Introducer  Needle gauge: 24 G Needle length: 10 cm Assessment Sensory level: T4 Additional Notes IV functioning, monitors applied to pt. Expiration date of kit checked and confirmed to be in date. Sterile prep and drape, hand hygiene and sterile gloved used. Pt was positioned and spine was prepped in sterile fashion. Skin was anesthetized with lidocaine. Free flow of clear CSF obtained prior to injecting local anesthetic into CSF x 1 attempt. Spinal needle aspirated freely following injection. Needle was carefully withdrawn, and pt tolerated procedure well. Loss of motor and sensory on exam post injection. Dr Gloris Manchester present for procedure.

## 2019-11-02 NOTE — Progress Notes (Signed)
Orthopedic Tech Progress Note Patient Details:  Andrew Greer [birth year] [id number]  CPM Right Knee CPM Right Knee: On Right Knee Flexion (Degrees): 90 Right Knee Extension (Degrees): 0  Post Interventions Patient Tolerated: Well Instructions Provided: Care of device Ortho Devices Type of Ortho Device: CPM padding, Bone foam zero knee Ortho Device/Splint Interventions: Ordered, Application, Adjustment   Post Interventions Patient Tolerated: Well Instructions Provided: Care of device   [name] 11/02/2019, 3:30 PM

## 2019-11-02 NOTE — Plan of Care (Signed)
  Problem: Education: Goal: Knowledge of the prescribed therapeutic regimen will improve Outcome: Progressing   Problem: Pain Management: Goal: Pain level will decrease with appropriate interventions Outcome: Progressing   

## 2019-11-02 NOTE — Op Note (Signed)
NAME: Andrew Greer, Andrew Greer MEDICAL RECORD PT:[id number] ACCOUNT [id number] DATE OF BIRTH:[birth year] FACILITY: [facility] LOCATION: [facility] PHYSICIAN:STEVEN Orlena Sheldon, MD  OPERATIVE REPORT  DATE OF PROCEDURE:  11/02/2019  PREOPERATIVE DIAGNOSIS:  Right knee end-stage arthritis.  POSTOPERATIVE DIAGNOSIS:  Right knee end-stage arthritis.  PROCEDURE PERFORMED:  Right total knee arthroplasty using DePuy Attune, prosthesis.  ATTENDING SURGEON:  Esmond Plants, MD  ASSISTANT:  Darol Destine, [place], who was scrubbed during the entire procedure and necessary for satisfactory completion of surgery.  ANESTHESIA:  Spinal anesthesia plus adductor canal block was used.  ESTIMATED BLOOD LOSS:  Minimal.  FLUID REPLACEMENT:  1500 mL crystalloid.  TOURNIQUET TIME:  One hour and 20 minutes at 325 mmHg.  INSTRUMENT COUNTS:  Correct.  COMPLICATIONS:  No complications.  ANTIBIOTICS:  Perioperative antibiotics were given.  INDICATIONS:  The patient is a 68 year old male with a history of worsening right knee pain secondary to end-stage arthritis, bone-on-bone.  The patient has had progressive pain despite conservative management and desires operative treatment to restore  function and eliminate pain in the knee.  Informed consent obtained.  DESCRIPTION OF PROCEDURE:  After an adequate level of anesthesia was achieved, the patient was positioned supine on the operating room table.  A nonsterile tourniquet was placed on right proximal thigh.  Right leg sterilely prepped and draped in the  usual manner.  Time-out called, verifying correct patient, correct site.  We elevated the leg and exsanguinated with an Esmarch bandage, elevating tourniquet to 325 mmHg.  We placed the knee in flexion, performed a longitudinal midline incision with a 10  blade scalpel.  Dissection down through subcutaneous tissues using Bovie.  We identified the peripatellar tissues and we performed a medial parapatellar arthrotomy with a  fresh 10 blade scalpel.  We divided the lateral patellofemoral ligaments, everted  the patella, exposing the distal femur, which was devoid of cartilage.  It was bone-on-bone.  We went ahead and entered the distal femur with a step cut drill.  We then placed our distal femoral resection guide, resecting 9 mm of distal bone set on 5  degrees right.  We then performed our anterior, posterior and chamfer cuts off the 4-in-1 block using the anterior down reference.  Once we had those cuts made, we removed ACL, PCL and meniscal tissues.  We subluxed the tibia anteriorly and using the  external alignment jig for the tibia, we performed a perpendicular cut to the long axis of the tibia with minimal posterior slope for this posterior cruciate substituting prosthesis.  We used our oscillating saw and resected 2 mm off the affected medial  side.  We then went ahead and placed a laminar spreader and removed excess posterior osteophytes off the femur and in the posterior aspect of the notch.  We then checked our flexion and extension gaps, which were symmetric at greater than 6 mm.  We felt  like they were very balanced.  At this point, we completed our tibial preparation with the modular drill and keel punch.  We then completed our femoral preparation with drilling the lug holes and then performing our box cut.  Once we had that done with  the oscillating saw again and had our lug holes drilled, we trialled with the first 6 mm spacer block.  We felt like we could definitely pull up from that.  We placed the knee in extension.  We then resurfaced the patella going from a 28 mm thickness  down to an 18 mm thickness and  drilling the lug holes for the 41 patellar button.  We removed all trial components, irrigated thoroughly and then dried the bone thoroughly.  We vacuum mixed high viscosity cement on the back table and cemented the  components into place, which was a size 9 tibia, size 9 right femur and we placed a 7 mm  spacer block in place.  We were able to get the knee in full extension.  We held the knee in extension while the cement hardened.  We used a patellar clamp to hold  the patella button in place while the cement set up.  Once all the cement was dried, we removed excess cement with the quarter-inch curved osteotome.  We reexamined the knee and felt that we could get the 10 mm spacer in.  So, we selected the size 9, 10  mm poly and trialed that and were happy with our flexion stability and the ability to get full extension.  We removed the trial components, selected the real size 9, 10 mm, placed that on the tibial tray and then reduced the knee.  We had nice little pop  as the medial femoral condyle reduced over the tibial tray and again we had full extension and nice and stable in flexion.  Patellar tracking was excellent.  We irrigated thoroughly.  I did use Exparel with saline and 0.25% Marcaine with epinephrine in  the synovium around the knee anteriorly and posteriorly.  At this point, we closed the parapatellar arthrotomy with #1 Vicryl suture, followed by 2-0 Vicryl for subcutaneous closure and running 4-0 Monocryl for skin.  Steri-Strips applied, followed by a  sterile dressing.  The patient tolerated surgery well.  VN/NUANCE  D:11/02/2019 T:11/02/2019 JOB:[id number]

## 2019-11-02 NOTE — Transfer of Care (Signed)
Immediate Anesthesia Transfer of Care Note  Patient: Andrew Greer  Procedure(s) Performed: TOTAL KNEE ARTHROPLASTY (Right Knee)  Patient Location: PACU  Anesthesia Type:Spinal  Level of Consciousness: awake, alert  and oriented  Airway & Oxygen Therapy: Patient Spontanous Breathing and Patient connected to face mask oxygen  Post-op Assessment: Report given to RN and Post -op Vital signs reviewed and stable  Post vital signs: Reviewed and stable  Last Vitals:  Vitals Value Taken Time  BP    Temp    Pulse 82 11/02/19 1423  Resp 17 11/02/19 1424  SpO2 96 % 11/02/19 1423  Vitals shown include unvalidated device data.  Last Pain:  Vitals:   11/02/19 1021  TempSrc:   PainSc: 0-No pain      Patients Stated Pain Goal: 3 (12/22/73 8832)  Complications: No complications documented.

## 2019-11-02 NOTE — Evaluation (Signed)
Physical Therapy Evaluation Patient Details Name: Andrew Greer MRN: [id number] DOB: [birth year] Today's Date: 11/02/2019   History of Present Illness  Patient is 69 y.o. male s/p Rt TKA on 11/02/19 with PMH significant for MI, hypothyroidism, HTN, HLD, OA.  Clinical Impression  Andrew Greer is a 69 y.o. male POD 0 s/p Rt TKA. Patient reports independence with mobility at baseline. Patient is now limited by functional impairments (see PT problem list below) and requires min assist for transfers and gait with RW. Patient was able to ambulate ~90 feet with RW and min assist. Patient instructed in exercise to facilitate ROM and circulation. Patient will benefit from continued skilled PT interventions to address impairments and progress towards PLOF. Acute PT will follow to progress mobility and stair training in preparation for safe discharge home.     Follow Up Recommendations Follow surgeon's recommendation for DC plan and follow-up therapies;Outpatient PT    Equipment Recommendations  Rolling walker with 5" wheels;3in1 (PT)    Recommendations for Other Services       Precautions / Restrictions Precautions Precautions: Fall Restrictions Weight Bearing Restrictions: No Other Position/Activity Restrictions: WBAT      Mobility  Bed Mobility Overal bed mobility: Needs Assistance Bed Mobility: Supine to Sit     Supine to sit: Min guard;Supervision;HOB elevated     General bed mobility comments: no assist required, pt using bed rail.    Transfers Overall transfer level: Needs assistance Equipment used: Rolling walker (2 wheeled) Transfers: Sit to/from Stand Sit to Stand: Min assist         General transfer comment: bed slightly elevated, min assist for power up and cues for safe hand placement on RW. pt steady in standing.   Ambulation/Gait Ambulation/Gait assistance: Min assist;Min guard Gait Distance (Feet): 90 Feet Assistive device: Rolling walker (2 wheeled) Gait  Pattern/deviations: Step-to pattern;Decreased stride length;Decreased weight shift to right Gait velocity: decr   General Gait Details: verbal cues for step pattern and proximity to RW, no overt LOB noted. pt with good use of UE's to reduce weight on Rt LE and prevent buckling.   Stairs            Wheelchair Mobility    Modified Rankin (Stroke Patients Only)       Balance Overall balance assessment: Needs assistance Sitting-balance support: Feet supported Sitting balance-Leahy Scale: Good     Standing balance support: During functional activity;Bilateral upper extremity supported Standing balance-Leahy Scale: Fair                               Pertinent Vitals/Pain Pain Assessment: 0-10 Pain Score: 7  Pain Location: Rt knee Pain Descriptors / Indicators: Aching;Discomfort;Sore Pain Intervention(s): Limited activity within patient's tolerance;Monitored during session;Premedicated before session;RN gave pain meds during session;Repositioned;Ice applied    Home Living Family/patient expects to be discharged to:: Private residence Living Arrangements: Spouse/significant other Available Help at Discharge: Family Type of Home: House Home Access: Stairs to enter Entrance Stairs-Rails: None Technical brewer of Steps: 2 Home Layout: One level Home Equipment: Crutches (walking sticks)      Prior Function Level of Independence: Independent               Hand Dominance   Dominant Hand: Right    Extremity/Trunk Assessment   Upper Extremity Assessment Upper Extremity Assessment: Overall WFL for tasks assessed    Lower Extremity Assessment Lower Extremity Assessment: Overall WFL for tasks assessed;RLE  deficits/detail RLE Deficits / Details: good quad activation, no extensor lag with SLR RLE Sensation: WNL RLE Coordination: WNL    Cervical / Trunk Assessment Cervical / Trunk Assessment: Normal  Communication   Communication: No  difficulties  Cognition Arousal/Alertness: Awake/alert Behavior During Therapy: WFL for tasks assessed/performed Overall Cognitive Status: Within Functional Limits for tasks assessed                                        General Comments      Exercises Total Joint Exercises Ankle Circles/Pumps: AROM;Both;20 reps;Seated Quad Sets: AROM;Right;10 reps;Seated Gluteal Sets: AROM;Right;10 reps;Seated   Assessment/Plan    PT Assessment Patient needs continued PT services  PT Problem List Decreased strength;Decreased range of motion;Decreased activity tolerance;Decreased balance;Decreased mobility;Decreased knowledge of use of DME;Decreased knowledge of precautions       PT Treatment Interventions DME instruction;Gait training;Stair training;Functional mobility training;Therapeutic activities;Therapeutic exercise;Balance training;Patient/family education    PT Goals (Current goals can be found in the Care Plan section)  Acute Rehab PT Goals Patient Stated Goal: recover and get back to building barn PT Goal Formulation: With patient Time For Goal Achievement: 11/09/19 Potential to Achieve Goals: Good    Frequency 7X/week   Barriers to discharge        Co-evaluation               AM-PAC PT "6 Clicks" Mobility  Outcome Measure Help needed turning from your back to your side while in a flat bed without using bedrails?: None Help needed moving from lying on your back to sitting on the side of a flat bed without using bedrails?: None Help needed moving to and from a bed to a chair (including a wheelchair)?: A Little Help needed standing up from a chair using your arms (e.g., wheelchair or bedside chair)?: A Little Help needed to walk in hospital room?: A Little Help needed climbing 3-5 steps with a railing? : A Little 6 Click Score: 20    End of Session Equipment Utilized During Treatment: Gait belt Activity Tolerance: Patient tolerated treatment  well Patient left: in chair;with call bell/phone within reach;with chair alarm set;with family/visitor present Nurse Communication: Mobility status PT Visit Diagnosis: Muscle weakness (generalized) (M62.81);Difficulty in walking, not elsewhere classified (R26.2)    Time: 7591-6384 PT Time Calculation (min) (ACUTE ONLY): 32 min   Charges:   PT Evaluation $PT Eval Low Complexity: 1 Low PT Treatments $Gait Training: 8-22 mins        Verner Mould, DPT Acute Rehabilitation Services  Office [phone] Pager [phone]  11/02/2019 6:26 PM

## 2019-11-02 NOTE — Anesthesia Procedure Notes (Addendum)
Procedure Name: MAC Date/Time: 11/02/2019 12:03 PM Performed by: West Pugh, CRNA Pre-anesthesia Checklist: Patient identified, Emergency Drugs available, Suction available, Patient being monitored and Timeout performed Patient Re-evaluated:Patient Re-evaluated prior to induction Oxygen Delivery Method: Simple face mask Preoxygenation: Pre-oxygenation with 100% oxygen Placement Confirmation: positive ETCO2 Dental Injury: Teeth and Oropharynx as per pre-operative assessment

## 2019-11-02 NOTE — Discharge Instructions (Signed)
Ice to the knee constantly.  Keep the incision covered and clean and dry for one week, then ok to get it wet in the shower.  Do exercise as instructed every hour, please to prevent stiffness.    DO NOT prop anything under the knee, it will make your knee stiff.  Prop under the ankle to encourage your knee to go straight.   Use the walker while you are up and around for balance.  Wear your support stockings 24/7 to prevent blood clots and take baby aspirin twice daily for 30 days also to prevent blood clots  Follow up with Dr Marquel Spoto in two weeks in the office, call [phone] for appt  

## 2019-11-02 NOTE — Anesthesia Preprocedure Evaluation (Addendum)
Anesthesia Evaluation  Patient identified by MRN, date of birth, ID band Patient awake    Airway Mallampati: II  TM Distance: >3 FB Neck ROM: Full    Dental  (+) Teeth Intact   Pulmonary former smoker,    Pulmonary exam normal        Cardiovascular hypertension, + Past MI   Rhythm:Regular Rate:Normal     Neuro/Psych    GI/Hepatic   Endo/Other    Renal/GU      Musculoskeletal   Abdominal (+)  Abdomen: soft. Bowel sounds: normal.  Peds  Hematology   Anesthesia Other Findings   Reproductive/Obstetrics                            Anesthesia Physical Anesthesia Plan  ASA: III  Anesthesia Plan: Spinal, MAC and Regional   Post-op Pain Management:  Regional for Post-op pain   Induction:   PONV Risk Score and Plan: Ondansetron, Dexamethasone, Propofol infusion and Treatment may vary due to age or medical condition  Airway Management Planned: Simple Face Mask and Nasal Cannula  Additional Equipment: None  Intra-op Plan:   Post-operative Plan:   Informed Consent: I have reviewed the patients History and Physical, chart, labs and discussed the procedure including the risks, benefits and alternatives for the proposed anesthesia with the patient or authorized representative who has indicated his/her understanding and acceptance.     Dental advisory given  Plan Discussed with: CRNA  Anesthesia Plan Comments: (Lab Results      Component                Value               Date                      WBC                      5.9                 10/26/2019                HGB                      16.8                10/26/2019                HCT                      48.6                10/26/2019                MCV                      98.0                10/26/2019                PLT                      254                 10/26/2019          )        Anesthesia Quick Evaluation

## 2019-11-02 NOTE — Interval H&P Note (Signed)
History and Physical Interval Note:  11/02/2019 11:23 AM  Andrew Greer  has presented today for surgery, with the diagnosis of Right knee osteoarthritis.  The various methods of treatment have been discussed with the patient and family. After consideration of risks, benefits and other options for treatment, the patient has consented to  Procedure(s): TOTAL KNEE ARTHROPLASTY (Right) as a surgical intervention.  The patient's history has been reviewed, patient examined, no change in status, stable for surgery.  I have reviewed the patient's chart and labs.  Questions were answered to the patient's satisfaction.     [name]

## 2019-11-03 DIAGNOSIS — M1711 Unilateral primary osteoarthritis, right knee: Secondary | ICD-10-CM | POA: Diagnosis not present

## 2019-11-03 DIAGNOSIS — Z87891 Personal history of nicotine dependence: Secondary | ICD-10-CM | POA: Diagnosis not present

## 2019-11-03 DIAGNOSIS — I10 Essential (primary) hypertension: Secondary | ICD-10-CM | POA: Diagnosis not present

## 2019-11-03 DIAGNOSIS — Z7982 Long term (current) use of aspirin: Secondary | ICD-10-CM | POA: Diagnosis not present

## 2019-11-03 DIAGNOSIS — Z79899 Other long term (current) drug therapy: Secondary | ICD-10-CM | POA: Diagnosis not present

## 2019-11-03 DIAGNOSIS — E039 Hypothyroidism, unspecified: Secondary | ICD-10-CM | POA: Diagnosis not present

## 2019-11-03 LAB — BASIC METABOLIC PANEL
Anion gap: 10 (ref 5–15)
BUN: 17 mg/dL (ref 8–23)
CO2: 28 mmol/L (ref 22–32)
Calcium: 9 mg/dL (ref 8.9–10.3)
Chloride: 103 mmol/L (ref 98–111)
Creatinine, Ser: 1.22 mg/dL (ref 0.61–1.24)
GFR, Estimated: >60 mL/min (ref >60)
Glucose, Bld: 127 mg/dL — ABNORMAL HIGH (ref 70–99)
Potassium: 4.7 mmol/L (ref 3.5–5.1)
Sodium: 141 mmol/L (ref 135–145)

## 2019-11-03 LAB — CBC
HCT: 40.3 % (ref 39.0–52.0)
Hemoglobin: 13.8 g/dL (ref 13.0–17.0)
MCH: 34.1 pg — ABNORMAL HIGH (ref 26.0–34.0)
MCHC: 34.2 g/dL (ref 30.0–36.0)
MCV: 99.5 fL (ref 80.0–100.0)
Platelets: 246 10*3/uL (ref 150–400)
RBC: 4.05 MIL/uL — ABNORMAL LOW (ref 4.22–5.81)
RDW: 12.6 % (ref 11.5–15.5)
WBC: 13.3 10*3/uL — ABNORMAL HIGH (ref 4.0–10.5)
nRBC: 0 % (ref 0.0–0.2)

## 2019-11-03 MED ORDER — OXYCODONE HCL 5 MG PO TABS
15.0000 mg | ORAL_TABLET | ORAL | Status: DC | PRN
  Administered 2019-11-03 (×2): 15 mg via ORAL
  Filled 2019-11-03 (×2): qty 3

## 2019-11-03 NOTE — Plan of Care (Signed)
Patient discharged home in stable condition 

## 2019-11-03 NOTE — Care Management Important Message (Signed)
Important Message  Patient Details  Name: BERRY GODSEY MRN: [id number] Date of Birth: 06-Apr-1950   Medicare Important Message Given:    Yes     Norina Buzzard, RN 11/03/2019, 9:12 AM

## 2019-11-03 NOTE — Discharge Summary (Signed)
In most cases prophylactic antibiotics for Dental procdeures after total joint surgery are not necessary.  Exceptions are as follows:  1. History of prior total joint infection  2. Severely immunocompromised (Organ Transplant, cancer chemotherapy, Rheumatoid biologic meds such as [place])  3. Poorly controlled diabetes (A1C &gt; 8.0, blood glucose over 200)  If you have one of these conditions, contact your surgeon for an antibiotic prescription, prior to your dental procedure. Orthopedic Discharge Summary        Physician Discharge Summary  Patient ID: Andrew Greer MRN: [id number] DOB/AGE: [birth year] 69 y.o.  Admit date: 11/02/2019 Discharge date: 11/03/2019   Procedures:  Procedure(s) (LRB): TOTAL KNEE ARTHROPLASTY (Right)  Attending Physician:  Dr. Esmond Plants  Admission Diagnoses:   Right knee end stage OA  Discharge Diagnoses:  same   Past Medical History:  Diagnosis Date  . Arthritis   . Colon polyps   . Essential hypertension, benign   . Hypothyroidism   . MI (myocardial infarction) ([place])   . Other and unspecified hyperlipidemia     PCP: Tisovec, Fransico Him, MD   Discharged Condition: good  Hospital Course:  Patient underwent the above stated procedure on 11/02/2019. Patient tolerated the procedure well and brought to the recovery room in good condition and subsequently to the floor. Patient had an uncomplicated hospital course and was stable for discharge.   Disposition: Discharge disposition: 01-Home or Self Care      with follow up in 2 weeks    Follow-up Information    Netta Cedars, MD. Call in 2 weeks.   Specialty: Orthopedic Surgery Why: [phone] Contact information: [address] [place] [address] [phone]               Dental Antibiotics:  In most cases prophylactic antibiotics for Dental procdeures after total joint surgery are not necessary.  Exceptions are as follows:  1. History  of prior total joint infection  2. Severely immunocompromised (Organ Transplant, cancer chemotherapy, Rheumatoid biologic meds such as [place])  3. Poorly controlled diabetes (A1C &gt; 8.0, blood glucose over 200)  If you have one of these conditions, contact your surgeon for an antibiotic prescription, prior to your dental procedure.  Discharge Instructions    Call MD / Call 911   Complete by: As directed    If you experience chest pain or shortness of breath, CALL 911 and be transported to the hospital emergency room.  If you develope a fever above 101 F, pus (white drainage) or increased drainage or redness at the wound, or calf pain, call your surgeon's office.   Constipation Prevention   Complete by: As directed    Drink plenty of fluids.  Prune juice may be helpful.  You may use a stool softener, such as Colace (over the counter) 100 mg twice a day.  Use MiraLax (over the counter) for constipation as needed.   Diet - low sodium heart healthy   Complete by: As directed    Increase activity slowly as tolerated   Complete by: As directed       Allergies as of 11/03/2019   No Known Allergies     Medication List    STOP taking these medications   HYDROcodone-acetaminophen 5-325 MG tablet Commonly known as: NORCO/VICODIN     TAKE these medications   acetaminophen 500 MG tablet Commonly known as: TYLENOL Take 1,000 mg by mouth every 6 (six) hours as needed for moderate pain or headache.  aspirin EC 81 MG tablet Take 1 tablet (81 mg total) by mouth in the morning and at bedtime. What changed: when to take this   fenofibrate 160 MG tablet Take 160 mg by mouth daily.   levothyroxine 125 MCG tablet Commonly known as: SYNTHROID Take 125 mcg by mouth daily before breakfast.   methocarbamol 500 MG tablet Commonly known as: Robaxin Take 1 tablet (500 mg total) by mouth every 6 (six) hours as needed for muscle spasms.   ondansetron 4 MG tablet Commonly known as:  Zofran Take 1 tablet (4 mg total) by mouth every 8 (eight) hours as needed for nausea, vomiting or refractory nausea / vomiting.   oxyCODONE-acetaminophen 7.5-325 MG tablet Commonly known as: Percocet Take 1 tablet by mouth every 4 (four) hours as needed for moderate pain or severe pain.   ramipril 10 MG capsule Commonly known as: ALTACE Take 10 mg by mouth daily.   rosuvastatin 20 MG tablet Commonly known as: CRESTOR Take 20 mg by mouth daily.         Signed: [name] 11/03/2019, 6:48 AM  [facility] is now [facility]  [place] [address] [place], [place], [place] [address] Phone: [place]

## 2019-11-03 NOTE — Progress Notes (Signed)
Physical Therapy Treatment Patient Details Name: Andrew Greer MRN: [id number] DOB: [birth year] Today's Date: 11/03/2019    History of Present Illness Patient is 69 y.o. male s/p Rt TKA on 11/02/19 with PMH significant for MI, hypothyroidism, HTN, HLD, OA.    PT Comments    Pt assisted with ambulating in hallway and practiced safe stair technique.  Pt likely to d/c home later today.    Follow Up Recommendations  Follow surgeon's recommendation for DC plan and follow-up therapies;Outpatient PT     Equipment Recommendations  Rolling walker with 5" wheels;3in1 (PT)    Recommendations for Other Services       Precautions / Restrictions Precautions Precautions: Fall;Knee Restrictions Other Position/Activity Restrictions: WBAT    Mobility  Bed Mobility Overal bed mobility: Needs Assistance Bed Mobility: Supine to Sit     Supine to sit: Supervision;HOB elevated        Transfers Overall transfer level: Needs assistance Equipment used: Rolling walker (2 wheeled) Transfers: Sit to/from Stand Sit to Stand: Min guard         General transfer comment: verbal cues for UE and LE positioning  Ambulation/Gait Ambulation/Gait assistance: Min guard Gait Distance (Feet): 240 Feet Assistive device: Rolling walker (2 wheeled) Gait Pattern/deviations: Decreased stride length;Decreased weight shift to right;Step-through pattern     General Gait Details: verbal cues for sequence, RW positioning, posture   Stairs Stairs: Yes Stairs assistance: Min guard Stair Management: Step to pattern;Backwards;With walker Number of Stairs: 3 General stair comments: verbal cues for safe technique, sequence; pt reports understanding   Wheelchair Mobility    Modified Rankin (Stroke Patients Only)       Balance                                            Cognition Arousal/Alertness: Awake/alert Behavior During Therapy: WFL for tasks assessed/performed Overall  Cognitive Status: Within Functional Limits for tasks assessed                                        Exercises      General Comments        Pertinent Vitals/Pain Pain Assessment: 0-10 Pain Score: 5  Pain Location: Rt thigh Pain Descriptors / Indicators: Tightness;Discomfort Pain Intervention(s): Repositioned;Monitored during session;Ice applied;Heat applied (ice to incision, heat to thigh only (explained to pt))    Home Living                      Prior Function            PT Goals (current goals can now be found in the care plan section) Progress towards PT goals: Progressing toward goals    Frequency    7X/week      PT Plan Current plan remains appropriate    Co-evaluation              AM-PAC PT "6 Clicks" Mobility   Outcome Measure  Help needed turning from your back to your side while in a flat bed without using bedrails?: None Help needed moving from lying on your back to sitting on the side of a flat bed without using bedrails?: None Help needed moving to and from a bed to a chair (including a wheelchair)?: A Little Help needed  standing up from a chair using your arms (e.g., wheelchair or bedside chair)?: A Little Help needed to walk in hospital room?: A Little Help needed climbing 3-5 steps with a railing? : A Little 6 Click Score: 20    End of Session Equipment Utilized During Treatment: Gait belt Activity Tolerance: Patient tolerated treatment well Patient left: in chair;with call bell/phone within reach;with chair alarm set;with family/visitor present Nurse Communication: Mobility status PT Visit Diagnosis: Muscle weakness (generalized) (M62.81);Difficulty in walking, not elsewhere classified (R26.2)     Time: 8563-1497 PT Time Calculation (min) (ACUTE ONLY): 18 min  Charges:  $Gait Training: 8-22 mins                    Arlyce Dice, DPT Acute Rehabilitation Services Pager: [phone] Office:  [phone]  York Ram E 11/03/2019, 1:36 PM

## 2019-11-03 NOTE — Progress Notes (Signed)
Orthopedics Progress Note  Subjective: Some pain this AM, controlled with po meds, wants to go home  Objective:  Vitals:   11/03/19 0136 11/03/19 0536  BP: 105/65 103/67  Pulse: 86 81  Resp: 15 16  Temp: 98.5 F (36.9 C) 98.7 F (37.1 C)  SpO2: 93% 92%    General: Awake and alert  Musculoskeletal: right knee incision CDI, great quad set, flexion to 90, Neg Homans Neurovascularly intact  Lab Results  Component Value Date   WBC 13.3 (H) 11/03/2019   HGB 13.8 11/03/2019   HCT 40.3 11/03/2019   MCV 99.5 11/03/2019   PLT 246 11/03/2019       Component Value Date/Time   NA 141 11/03/2019 0311   K 4.7 11/03/2019 0311   CL 103 11/03/2019 0311   CO2 28 11/03/2019 0311   GLUCOSE 127 (H) 11/03/2019 0311   BUN 17 11/03/2019 0311   CREATININE 1.22 11/03/2019 0311   CALCIUM 9.0 11/03/2019 0311   GFRNONAA >60 11/03/2019 0311    No results found for: INR, PROTIME  Assessment/Plan: POD #1 s/p Procedure(s): TOTAL KNEE ARTHROPLASTY Stable for discharge after therapy  Follow up in two weeks  Doran Heater. Veverly Fells, MD 11/03/2019 6:46 AM

## 2019-11-03 NOTE — Progress Notes (Signed)
Physical Therapy Treatment Patient Details Name: Andrew Greer MRN: [id number] DOB: Jun 07, 1950 Today's Date: 11/03/2019    History of Present Illness Patient is 69 y.o. male s/p Rt TKA on 11/02/19 with PMH significant for MI, hypothyroidism, HTN, HLD, OA.    PT Comments    Pt ambulated in hallway and practiced stairs again (with his spouse this afternoon).  Pt also reviewed HEP handout and had no further questions.  Pt feels ready for d/c home today.   Follow Up Recommendations  Follow surgeon's recommendation for DC plan and follow-up therapies;Outpatient PT     Equipment Recommendations  Rolling walker with 5" wheels;3in1 (PT)    Recommendations for Other Services       Precautions / Restrictions Precautions Precautions: Fall;Knee Restrictions Other Position/Activity Restrictions: WBAT    Mobility  Bed Mobility Overal bed mobility: Needs Assistance Bed Mobility: Supine to Sit     Supine to sit: Supervision;HOB elevated     General bed mobility comments: pt in recliner  Transfers Overall transfer level: Needs assistance Equipment used: Rolling walker (2 wheeled) Transfers: Sit to/from Stand Sit to Stand: Min guard;Supervision         General transfer comment: verbal cues for UE and LE positioning  Ambulation/Gait Ambulation/Gait assistance: Min guard;Supervision Gait Distance (Feet): 240 Feet Assistive device: Rolling walker (2 wheeled) Gait Pattern/deviations: Decreased stride length;Decreased weight shift to right;Step-through pattern     General Gait Details: verbal cues for sequence, RW positioning, posture   Stairs Stairs: Yes Stairs assistance: Supervision Stair Management: Step to pattern;Backwards;With walker Number of Stairs: 3 General stair comments: pt educated his spouse on assisting with RW, cued and performed stairs correctly   Wheelchair Mobility    Modified Rankin (Stroke Patients Only)       Balance                                             Cognition Arousal/Alertness: Awake/alert Behavior During Therapy: WFL for tasks assessed/performed Overall Cognitive Status: Within Functional Limits for tasks assessed                                        Exercises Total Joint Exercises Knee Flexion: AAROM;Right;10 reps;Seated    General Comments        Pertinent Vitals/Pain Pain Assessment: 0-10 Pain Score: 5  Pain Location: Rt thigh Pain Descriptors / Indicators: Tightness;Discomfort Pain Intervention(s): Repositioned;Monitored during session    Home Living                      Prior Function            PT Goals (current goals can now be found in the care plan section) Progress towards PT goals: Progressing toward goals    Frequency    7X/week      PT Plan Current plan remains appropriate    Co-evaluation              AM-PAC PT "6 Clicks" Mobility   Outcome Measure  Help needed turning from your back to your side while in a flat bed without using bedrails?: None Help needed moving from lying on your back to sitting on the side of a flat bed without using bedrails?: None Help needed moving to and  from a bed to a chair (including a wheelchair)?: A Little Help needed standing up from a chair using your arms (e.g., wheelchair or bedside chair)?: A Little Help needed to walk in hospital room?: A Little Help needed climbing 3-5 steps with a railing? : A Little 6 Click Score: 20    End of Session Equipment Utilized During Treatment: Gait belt Activity Tolerance: Patient tolerated treatment well Patient left: in chair;with call bell/phone within reach;with family/visitor present Nurse Communication: Mobility status PT Visit Diagnosis: Muscle weakness (generalized) (M62.81);Difficulty in walking, not elsewhere classified (R26.2)     Time: 4849-8651 PT Time Calculation (min) (ACUTE ONLY): 21 min  Charges:  $Gait Training: 8-22  mins                    Arlyce Dice, DPT Acute Rehabilitation Services Pager: [phone] Office: [phone]  York Ram E 11/03/2019, 3:26 PM

## 2019-11-03 NOTE — Progress Notes (Signed)
Pt is s/p R TKA. PT is recommending a RW and a 3-1-BSC. Met with pt to discuss D/C plan. He plans to return home with the support of his wife. He reports that his doctor's office arranged outpt rehab. He agrees with DME. Haivana Nakya for DME referral (RW and 3-in-1 BSC). Referral accepted by [place].

## 2019-11-03 NOTE — Care Management Obs Status (Signed)
[facility] NOTIFICATION   Patient Details  Name: KOJO LIBY MRN: [id number] Date of Birth: 09-29-1950   Medicare Observation Status Notification Given:       Norina Buzzard, RN 11/03/2019, 9:10 AM

## 2019-11-05 ENCOUNTER — Encounter (HOSPITAL_COMMUNITY): Payer: Self-pay | Admitting: Orthopedic Surgery

## 2019-11-05 NOTE — Anesthesia Postprocedure Evaluation (Signed)
Anesthesia Post Note  Patient: Andrew Greer  Procedure(s) Performed: TOTAL KNEE ARTHROPLASTY (Right Knee)     Patient location during evaluation: PACU Anesthesia Type: Regional, MAC and Spinal Level of consciousness: awake and alert Pain management: pain level controlled Vital Signs Assessment: post-procedure vital signs reviewed and stable Respiratory status: spontaneous breathing, nonlabored ventilation, respiratory function stable and patient connected to nasal cannula oxygen Cardiovascular status: blood pressure returned to baseline and stable Postop Assessment: no apparent nausea or vomiting Anesthetic complications: no   No complications documented.  Last Vitals:  Vitals:   11/03/19 0951 11/03/19 1319  BP: 110/64 104/68  Pulse: 79 83  Resp: 16 16  Temp: 36.8 C 37.1 C  SpO2: 90% (!) 86%    Last Pain:  Vitals:   11/03/19 1319  TempSrc: Oral  PainSc:                  March Rummage Alizea Pell

## 2019-11-06 DIAGNOSIS — M25561 Pain in right knee: Secondary | ICD-10-CM | POA: Diagnosis not present

## 2019-11-08 DIAGNOSIS — M25561 Pain in right knee: Secondary | ICD-10-CM | POA: Diagnosis not present

## 2019-11-13 DIAGNOSIS — M25561 Pain in right knee: Secondary | ICD-10-CM | POA: Diagnosis not present

## 2019-11-15 DIAGNOSIS — Z4789 Encounter for other orthopedic aftercare: Secondary | ICD-10-CM | POA: Diagnosis not present

## 2019-11-15 DIAGNOSIS — M25561 Pain in right knee: Secondary | ICD-10-CM | POA: Diagnosis not present

## 2019-11-20 DIAGNOSIS — M25561 Pain in right knee: Secondary | ICD-10-CM | POA: Diagnosis not present

## 2019-11-22 DIAGNOSIS — M25561 Pain in right knee: Secondary | ICD-10-CM | POA: Diagnosis not present

## 2019-11-27 DIAGNOSIS — M25561 Pain in right knee: Secondary | ICD-10-CM | POA: Diagnosis not present

## 2019-12-04 DIAGNOSIS — M25561 Pain in right knee: Secondary | ICD-10-CM | POA: Diagnosis not present

## 2019-12-06 DIAGNOSIS — M25561 Pain in right knee: Secondary | ICD-10-CM | POA: Diagnosis not present

## 2019-12-13 DIAGNOSIS — Z23 Encounter for immunization: Secondary | ICD-10-CM | POA: Diagnosis not present

## 2020-02-12 DIAGNOSIS — Z4789 Encounter for other orthopedic aftercare: Secondary | ICD-10-CM | POA: Diagnosis not present

## 2020-03-03 DIAGNOSIS — E785 Hyperlipidemia, unspecified: Secondary | ICD-10-CM | POA: Diagnosis not present

## 2020-03-03 DIAGNOSIS — E039 Hypothyroidism, unspecified: Secondary | ICD-10-CM | POA: Diagnosis not present

## 2020-03-03 DIAGNOSIS — I2510 Atherosclerotic heart disease of native coronary artery without angina pectoris: Secondary | ICD-10-CM | POA: Diagnosis not present

## 2020-06-16 DIAGNOSIS — Z961 Presence of intraocular lens: Secondary | ICD-10-CM | POA: Diagnosis not present

## 2020-06-16 DIAGNOSIS — H40051 Ocular hypertension, right eye: Secondary | ICD-10-CM | POA: Diagnosis not present

## 2020-06-16 DIAGNOSIS — H5211 Myopia, right eye: Secondary | ICD-10-CM | POA: Diagnosis not present

## 2020-06-23 DIAGNOSIS — E039 Hypothyroidism, unspecified: Secondary | ICD-10-CM | POA: Diagnosis not present

## 2020-06-23 DIAGNOSIS — E7800 Pure hypercholesterolemia, unspecified: Secondary | ICD-10-CM | POA: Diagnosis not present

## 2020-06-23 DIAGNOSIS — R7309 Other abnormal glucose: Secondary | ICD-10-CM | POA: Diagnosis not present

## 2020-06-23 DIAGNOSIS — Z125 Encounter for screening for malignant neoplasm of prostate: Secondary | ICD-10-CM | POA: Diagnosis not present

## 2020-06-23 DIAGNOSIS — Z0000 Encounter for general adult medical examination without abnormal findings: Secondary | ICD-10-CM | POA: Diagnosis not present

## 2020-06-30 DIAGNOSIS — I119 Hypertensive heart disease without heart failure: Secondary | ICD-10-CM | POA: Diagnosis not present

## 2020-06-30 DIAGNOSIS — D692 Other nonthrombocytopenic purpura: Secondary | ICD-10-CM | POA: Diagnosis not present

## 2020-06-30 DIAGNOSIS — R7309 Other abnormal glucose: Secondary | ICD-10-CM | POA: Diagnosis not present

## 2020-06-30 DIAGNOSIS — Z1212 Encounter for screening for malignant neoplasm of rectum: Secondary | ICD-10-CM | POA: Diagnosis not present

## 2020-06-30 DIAGNOSIS — E785 Hyperlipidemia, unspecified: Secondary | ICD-10-CM | POA: Diagnosis not present

## 2020-06-30 DIAGNOSIS — R82998 Other abnormal findings in urine: Secondary | ICD-10-CM | POA: Diagnosis not present

## 2020-06-30 DIAGNOSIS — E7800 Pure hypercholesterolemia, unspecified: Secondary | ICD-10-CM | POA: Diagnosis not present

## 2020-06-30 DIAGNOSIS — Z1331 Encounter for screening for depression: Secondary | ICD-10-CM | POA: Diagnosis not present

## 2020-06-30 DIAGNOSIS — I252 Old myocardial infarction: Secondary | ICD-10-CM | POA: Diagnosis not present

## 2020-06-30 DIAGNOSIS — E669 Obesity, unspecified: Secondary | ICD-10-CM | POA: Diagnosis not present

## 2020-06-30 DIAGNOSIS — I2510 Atherosclerotic heart disease of native coronary artery without angina pectoris: Secondary | ICD-10-CM | POA: Diagnosis not present

## 2020-06-30 DIAGNOSIS — Z1389 Encounter for screening for other disorder: Secondary | ICD-10-CM | POA: Diagnosis not present

## 2020-06-30 DIAGNOSIS — Z0000 Encounter for general adult medical examination without abnormal findings: Secondary | ICD-10-CM | POA: Diagnosis not present

## 2020-06-30 DIAGNOSIS — E039 Hypothyroidism, unspecified: Secondary | ICD-10-CM | POA: Diagnosis not present

## 2020-07-06 DIAGNOSIS — Z20822 Contact with and (suspected) exposure to covid-19: Secondary | ICD-10-CM | POA: Diagnosis not present

## 2020-07-07 DIAGNOSIS — R509 Fever, unspecified: Secondary | ICD-10-CM | POA: Diagnosis not present

## 2020-07-07 DIAGNOSIS — R21 Rash and other nonspecific skin eruption: Secondary | ICD-10-CM | POA: Diagnosis not present

## 2020-07-07 DIAGNOSIS — W57XXXA Bitten or stung by nonvenomous insect and other nonvenomous arthropods, initial encounter: Secondary | ICD-10-CM | POA: Diagnosis not present

## 2020-07-13 DIAGNOSIS — Z20822 Contact with and (suspected) exposure to covid-19: Secondary | ICD-10-CM | POA: Diagnosis not present

## 2020-08-03 DIAGNOSIS — E785 Hyperlipidemia, unspecified: Secondary | ICD-10-CM | POA: Diagnosis not present

## 2020-08-03 DIAGNOSIS — I119 Hypertensive heart disease without heart failure: Secondary | ICD-10-CM | POA: Diagnosis not present

## 2020-08-03 DIAGNOSIS — E039 Hypothyroidism, unspecified: Secondary | ICD-10-CM | POA: Diagnosis not present

## 2020-09-03 DIAGNOSIS — E039 Hypothyroidism, unspecified: Secondary | ICD-10-CM | POA: Diagnosis not present

## 2020-09-03 DIAGNOSIS — E785 Hyperlipidemia, unspecified: Secondary | ICD-10-CM | POA: Diagnosis not present

## 2020-09-03 DIAGNOSIS — I119 Hypertensive heart disease without heart failure: Secondary | ICD-10-CM | POA: Diagnosis not present

## 2020-10-07 DIAGNOSIS — M25561 Pain in right knee: Secondary | ICD-10-CM | POA: Diagnosis not present

## 2020-10-08 DIAGNOSIS — Z23 Encounter for immunization: Secondary | ICD-10-CM | POA: Diagnosis not present

## 2020-10-16 DIAGNOSIS — N200 Calculus of kidney: Secondary | ICD-10-CM | POA: Diagnosis not present

## 2020-11-03 DIAGNOSIS — U071 COVID-19: Secondary | ICD-10-CM | POA: Diagnosis not present

## 2021-01-06 DIAGNOSIS — Z20822 Contact with and (suspected) exposure to covid-19: Secondary | ICD-10-CM | POA: Diagnosis not present

## 2021-02-25 DIAGNOSIS — M25512 Pain in left shoulder: Secondary | ICD-10-CM | POA: Diagnosis not present

## 2021-03-23 DIAGNOSIS — Z20822 Contact with and (suspected) exposure to covid-19: Secondary | ICD-10-CM | POA: Diagnosis not present

## 2021-05-06 DIAGNOSIS — Z20822 Contact with and (suspected) exposure to covid-19: Secondary | ICD-10-CM | POA: Diagnosis not present

## 2021-06-18 DIAGNOSIS — H11001 Unspecified pterygium of right eye: Secondary | ICD-10-CM | POA: Diagnosis not present

## 2021-07-14 DIAGNOSIS — E7800 Pure hypercholesterolemia, unspecified: Secondary | ICD-10-CM | POA: Diagnosis not present

## 2021-07-14 DIAGNOSIS — R7309 Other abnormal glucose: Secondary | ICD-10-CM | POA: Diagnosis not present

## 2021-07-14 DIAGNOSIS — Z125 Encounter for screening for malignant neoplasm of prostate: Secondary | ICD-10-CM | POA: Diagnosis not present

## 2021-07-14 DIAGNOSIS — E039 Hypothyroidism, unspecified: Secondary | ICD-10-CM | POA: Diagnosis not present

## 2021-07-14 DIAGNOSIS — R7989 Other specified abnormal findings of blood chemistry: Secondary | ICD-10-CM | POA: Diagnosis not present

## 2021-07-15 DIAGNOSIS — R82998 Other abnormal findings in urine: Secondary | ICD-10-CM | POA: Diagnosis not present

## 2021-07-21 DIAGNOSIS — E7800 Pure hypercholesterolemia, unspecified: Secondary | ICD-10-CM | POA: Diagnosis not present

## 2021-07-21 DIAGNOSIS — Z1331 Encounter for screening for depression: Secondary | ICD-10-CM | POA: Diagnosis not present

## 2021-07-21 DIAGNOSIS — Z0000 Encounter for general adult medical examination without abnormal findings: Secondary | ICD-10-CM | POA: Diagnosis not present

## 2021-07-21 DIAGNOSIS — G4700 Insomnia, unspecified: Secondary | ICD-10-CM | POA: Diagnosis not present

## 2021-07-21 DIAGNOSIS — E039 Hypothyroidism, unspecified: Secondary | ICD-10-CM | POA: Diagnosis not present

## 2021-07-21 DIAGNOSIS — I119 Hypertensive heart disease without heart failure: Secondary | ICD-10-CM | POA: Diagnosis not present

## 2021-07-21 DIAGNOSIS — D692 Other nonthrombocytopenic purpura: Secondary | ICD-10-CM | POA: Diagnosis not present

## 2021-07-21 DIAGNOSIS — R7309 Other abnormal glucose: Secondary | ICD-10-CM | POA: Diagnosis not present

## 2021-07-21 DIAGNOSIS — I252 Old myocardial infarction: Secondary | ICD-10-CM | POA: Diagnosis not present

## 2021-07-21 DIAGNOSIS — E669 Obesity, unspecified: Secondary | ICD-10-CM | POA: Diagnosis not present

## 2021-07-21 DIAGNOSIS — Z1389 Encounter for screening for other disorder: Secondary | ICD-10-CM | POA: Diagnosis not present

## 2021-07-21 DIAGNOSIS — I2510 Atherosclerotic heart disease of native coronary artery without angina pectoris: Secondary | ICD-10-CM | POA: Diagnosis not present

## 2021-09-18 DIAGNOSIS — H11001 Unspecified pterygium of right eye: Secondary | ICD-10-CM | POA: Diagnosis not present

## 2021-09-26 DIAGNOSIS — Z23 Encounter for immunization: Secondary | ICD-10-CM | POA: Diagnosis not present

## 2021-09-29 DIAGNOSIS — M25512 Pain in left shoulder: Secondary | ICD-10-CM | POA: Diagnosis not present

## 2021-10-11 DIAGNOSIS — M25512 Pain in left shoulder: Secondary | ICD-10-CM | POA: Diagnosis not present

## 2021-10-12 DIAGNOSIS — N200 Calculus of kidney: Secondary | ICD-10-CM | POA: Diagnosis not present

## 2021-10-15 DIAGNOSIS — M25512 Pain in left shoulder: Secondary | ICD-10-CM | POA: Diagnosis not present

## 2021-10-15 DIAGNOSIS — S43432A Superior glenoid labrum lesion of left shoulder, initial encounter: Secondary | ICD-10-CM | POA: Diagnosis not present

## 2021-10-15 DIAGNOSIS — M75102 Unspecified rotator cuff tear or rupture of left shoulder, not specified as traumatic: Secondary | ICD-10-CM | POA: Diagnosis not present

## 2021-10-28 DIAGNOSIS — S43012A Anterior subluxation of left humerus, initial encounter: Secondary | ICD-10-CM | POA: Diagnosis not present

## 2021-10-28 DIAGNOSIS — S46012A Strain of muscle(s) and tendon(s) of the rotator cuff of left shoulder, initial encounter: Secondary | ICD-10-CM | POA: Diagnosis not present

## 2021-10-28 DIAGNOSIS — S43432A Superior glenoid labrum lesion of left shoulder, initial encounter: Secondary | ICD-10-CM | POA: Diagnosis not present

## 2021-10-28 DIAGNOSIS — M948X1 Other specified disorders of cartilage, shoulder: Secondary | ICD-10-CM | POA: Diagnosis not present

## 2021-10-28 DIAGNOSIS — M94212 Chondromalacia, left shoulder: Secondary | ICD-10-CM | POA: Diagnosis not present

## 2021-10-28 DIAGNOSIS — Y999 Unspecified external cause status: Secondary | ICD-10-CM | POA: Diagnosis not present

## 2021-10-28 DIAGNOSIS — M7522 Bicipital tendinitis, left shoulder: Secondary | ICD-10-CM | POA: Diagnosis not present

## 2021-10-28 DIAGNOSIS — M24112 Other articular cartilage disorders, left shoulder: Secondary | ICD-10-CM | POA: Diagnosis not present

## 2021-10-28 DIAGNOSIS — X58XXXA Exposure to other specified factors, initial encounter: Secondary | ICD-10-CM | POA: Diagnosis not present

## 2021-10-28 DIAGNOSIS — G8918 Other acute postprocedural pain: Secondary | ICD-10-CM | POA: Diagnosis not present

## 2021-11-02 DIAGNOSIS — M25612 Stiffness of left shoulder, not elsewhere classified: Secondary | ICD-10-CM | POA: Diagnosis not present

## 2021-11-02 DIAGNOSIS — M25512 Pain in left shoulder: Secondary | ICD-10-CM | POA: Diagnosis not present

## 2021-11-05 DIAGNOSIS — M25612 Stiffness of left shoulder, not elsewhere classified: Secondary | ICD-10-CM | POA: Diagnosis not present

## 2021-11-05 DIAGNOSIS — M25512 Pain in left shoulder: Secondary | ICD-10-CM | POA: Diagnosis not present

## 2021-11-11 DIAGNOSIS — M25512 Pain in left shoulder: Secondary | ICD-10-CM | POA: Diagnosis not present

## 2021-11-11 DIAGNOSIS — M25612 Stiffness of left shoulder, not elsewhere classified: Secondary | ICD-10-CM | POA: Diagnosis not present

## 2021-11-13 DIAGNOSIS — M25512 Pain in left shoulder: Secondary | ICD-10-CM | POA: Diagnosis not present

## 2021-11-13 DIAGNOSIS — M25612 Stiffness of left shoulder, not elsewhere classified: Secondary | ICD-10-CM | POA: Diagnosis not present

## 2021-11-17 DIAGNOSIS — M25512 Pain in left shoulder: Secondary | ICD-10-CM | POA: Diagnosis not present

## 2021-11-17 DIAGNOSIS — M25612 Stiffness of left shoulder, not elsewhere classified: Secondary | ICD-10-CM | POA: Diagnosis not present

## 2021-11-19 DIAGNOSIS — M25612 Stiffness of left shoulder, not elsewhere classified: Secondary | ICD-10-CM | POA: Diagnosis not present

## 2021-11-19 DIAGNOSIS — M25512 Pain in left shoulder: Secondary | ICD-10-CM | POA: Diagnosis not present

## 2021-11-23 DIAGNOSIS — M25512 Pain in left shoulder: Secondary | ICD-10-CM | POA: Diagnosis not present

## 2021-11-23 DIAGNOSIS — M25612 Stiffness of left shoulder, not elsewhere classified: Secondary | ICD-10-CM | POA: Diagnosis not present

## 2021-12-01 DIAGNOSIS — M25512 Pain in left shoulder: Secondary | ICD-10-CM | POA: Diagnosis not present

## 2021-12-01 DIAGNOSIS — M25612 Stiffness of left shoulder, not elsewhere classified: Secondary | ICD-10-CM | POA: Diagnosis not present

## 2021-12-03 DIAGNOSIS — M25512 Pain in left shoulder: Secondary | ICD-10-CM | POA: Diagnosis not present

## 2021-12-03 DIAGNOSIS — M25612 Stiffness of left shoulder, not elsewhere classified: Secondary | ICD-10-CM | POA: Diagnosis not present

## 2021-12-25 DIAGNOSIS — M25512 Pain in left shoulder: Secondary | ICD-10-CM | POA: Diagnosis not present

## 2021-12-25 DIAGNOSIS — M25612 Stiffness of left shoulder, not elsewhere classified: Secondary | ICD-10-CM | POA: Diagnosis not present

## 2021-12-29 DIAGNOSIS — M25512 Pain in left shoulder: Secondary | ICD-10-CM | POA: Diagnosis not present

## 2021-12-29 DIAGNOSIS — M25612 Stiffness of left shoulder, not elsewhere classified: Secondary | ICD-10-CM | POA: Diagnosis not present

## 2021-12-31 DIAGNOSIS — M25512 Pain in left shoulder: Secondary | ICD-10-CM | POA: Diagnosis not present

## 2021-12-31 DIAGNOSIS — M25612 Stiffness of left shoulder, not elsewhere classified: Secondary | ICD-10-CM | POA: Diagnosis not present

## 2022-01-06 DIAGNOSIS — M25612 Stiffness of left shoulder, not elsewhere classified: Secondary | ICD-10-CM | POA: Diagnosis not present

## 2022-01-06 DIAGNOSIS — M25512 Pain in left shoulder: Secondary | ICD-10-CM | POA: Diagnosis not present

## 2022-01-08 DIAGNOSIS — M25612 Stiffness of left shoulder, not elsewhere classified: Secondary | ICD-10-CM | POA: Diagnosis not present

## 2022-01-08 DIAGNOSIS — M25512 Pain in left shoulder: Secondary | ICD-10-CM | POA: Diagnosis not present

## 2022-01-12 DIAGNOSIS — M25512 Pain in left shoulder: Secondary | ICD-10-CM | POA: Diagnosis not present

## 2022-01-12 DIAGNOSIS — M25612 Stiffness of left shoulder, not elsewhere classified: Secondary | ICD-10-CM | POA: Diagnosis not present

## 2022-01-15 DIAGNOSIS — M25512 Pain in left shoulder: Secondary | ICD-10-CM | POA: Diagnosis not present

## 2022-01-15 DIAGNOSIS — M25612 Stiffness of left shoulder, not elsewhere classified: Secondary | ICD-10-CM | POA: Diagnosis not present

## 2022-01-18 DIAGNOSIS — Z4789 Encounter for other orthopedic aftercare: Secondary | ICD-10-CM | POA: Diagnosis not present

## 2022-01-18 DIAGNOSIS — M25512 Pain in left shoulder: Secondary | ICD-10-CM | POA: Diagnosis not present

## 2022-01-18 DIAGNOSIS — M25612 Stiffness of left shoulder, not elsewhere classified: Secondary | ICD-10-CM | POA: Diagnosis not present

## 2022-01-20 DIAGNOSIS — M25512 Pain in left shoulder: Secondary | ICD-10-CM | POA: Diagnosis not present

## 2022-01-20 DIAGNOSIS — M25612 Stiffness of left shoulder, not elsewhere classified: Secondary | ICD-10-CM | POA: Diagnosis not present

## 2022-01-21 DIAGNOSIS — I2510 Atherosclerotic heart disease of native coronary artery without angina pectoris: Secondary | ICD-10-CM | POA: Diagnosis not present

## 2022-01-21 DIAGNOSIS — G4700 Insomnia, unspecified: Secondary | ICD-10-CM | POA: Diagnosis not present

## 2022-01-21 DIAGNOSIS — N200 Calculus of kidney: Secondary | ICD-10-CM | POA: Diagnosis not present

## 2022-01-21 DIAGNOSIS — R7309 Other abnormal glucose: Secondary | ICD-10-CM | POA: Diagnosis not present

## 2022-01-21 DIAGNOSIS — D692 Other nonthrombocytopenic purpura: Secondary | ICD-10-CM | POA: Diagnosis not present

## 2022-01-21 DIAGNOSIS — I252 Old myocardial infarction: Secondary | ICD-10-CM | POA: Diagnosis not present

## 2022-01-21 DIAGNOSIS — E7800 Pure hypercholesterolemia, unspecified: Secondary | ICD-10-CM | POA: Diagnosis not present

## 2022-01-21 DIAGNOSIS — I10 Essential (primary) hypertension: Secondary | ICD-10-CM | POA: Diagnosis not present

## 2022-01-21 DIAGNOSIS — D126 Benign neoplasm of colon, unspecified: Secondary | ICD-10-CM | POA: Diagnosis not present

## 2022-01-21 DIAGNOSIS — E669 Obesity, unspecified: Secondary | ICD-10-CM | POA: Diagnosis not present

## 2022-01-21 DIAGNOSIS — E039 Hypothyroidism, unspecified: Secondary | ICD-10-CM | POA: Diagnosis not present

## 2022-01-24 DIAGNOSIS — M25512 Pain in left shoulder: Secondary | ICD-10-CM | POA: Diagnosis not present

## 2022-02-03 DIAGNOSIS — M25612 Stiffness of left shoulder, not elsewhere classified: Secondary | ICD-10-CM | POA: Diagnosis not present

## 2022-02-03 DIAGNOSIS — M25512 Pain in left shoulder: Secondary | ICD-10-CM | POA: Diagnosis not present

## 2022-02-08 DIAGNOSIS — M25512 Pain in left shoulder: Secondary | ICD-10-CM | POA: Diagnosis not present

## 2022-02-08 DIAGNOSIS — M25612 Stiffness of left shoulder, not elsewhere classified: Secondary | ICD-10-CM | POA: Diagnosis not present

## 2022-02-10 DIAGNOSIS — M25512 Pain in left shoulder: Secondary | ICD-10-CM | POA: Diagnosis not present

## 2022-02-10 DIAGNOSIS — M25612 Stiffness of left shoulder, not elsewhere classified: Secondary | ICD-10-CM | POA: Diagnosis not present

## 2022-02-15 DIAGNOSIS — M25512 Pain in left shoulder: Secondary | ICD-10-CM | POA: Diagnosis not present

## 2022-02-15 DIAGNOSIS — M25612 Stiffness of left shoulder, not elsewhere classified: Secondary | ICD-10-CM | POA: Diagnosis not present

## 2022-02-16 DIAGNOSIS — Z4789 Encounter for other orthopedic aftercare: Secondary | ICD-10-CM | POA: Diagnosis not present

## 2022-02-24 DIAGNOSIS — M25612 Stiffness of left shoulder, not elsewhere classified: Secondary | ICD-10-CM | POA: Diagnosis not present

## 2022-02-24 DIAGNOSIS — M25512 Pain in left shoulder: Secondary | ICD-10-CM | POA: Diagnosis not present

## 2022-02-26 DIAGNOSIS — M25612 Stiffness of left shoulder, not elsewhere classified: Secondary | ICD-10-CM | POA: Diagnosis not present

## 2022-02-26 DIAGNOSIS — M25512 Pain in left shoulder: Secondary | ICD-10-CM | POA: Diagnosis not present

## 2022-03-03 DIAGNOSIS — M25512 Pain in left shoulder: Secondary | ICD-10-CM | POA: Diagnosis not present

## 2022-03-03 DIAGNOSIS — M25612 Stiffness of left shoulder, not elsewhere classified: Secondary | ICD-10-CM | POA: Diagnosis not present

## 2022-03-05 DIAGNOSIS — M25512 Pain in left shoulder: Secondary | ICD-10-CM | POA: Diagnosis not present

## 2022-03-05 DIAGNOSIS — M25612 Stiffness of left shoulder, not elsewhere classified: Secondary | ICD-10-CM | POA: Diagnosis not present

## 2022-03-10 DIAGNOSIS — M25612 Stiffness of left shoulder, not elsewhere classified: Secondary | ICD-10-CM | POA: Diagnosis not present

## 2022-03-10 DIAGNOSIS — M25512 Pain in left shoulder: Secondary | ICD-10-CM | POA: Diagnosis not present

## 2022-03-12 DIAGNOSIS — M25512 Pain in left shoulder: Secondary | ICD-10-CM | POA: Diagnosis not present

## 2022-03-12 DIAGNOSIS — M25612 Stiffness of left shoulder, not elsewhere classified: Secondary | ICD-10-CM | POA: Diagnosis not present

## 2022-05-22 IMAGING — DX DG KNEE 1-2V PORT*R*
2 series · 2 of 2 positions shown · non-contrast
Comparison: Portable exam 3939 hours compared to 04/14/2017

CLINICAL DATA: Post RIGHT-side knee replacement

EXAM:
PORTABLE RIGHT KNEE - 1-2 VIEW

[knee ap]
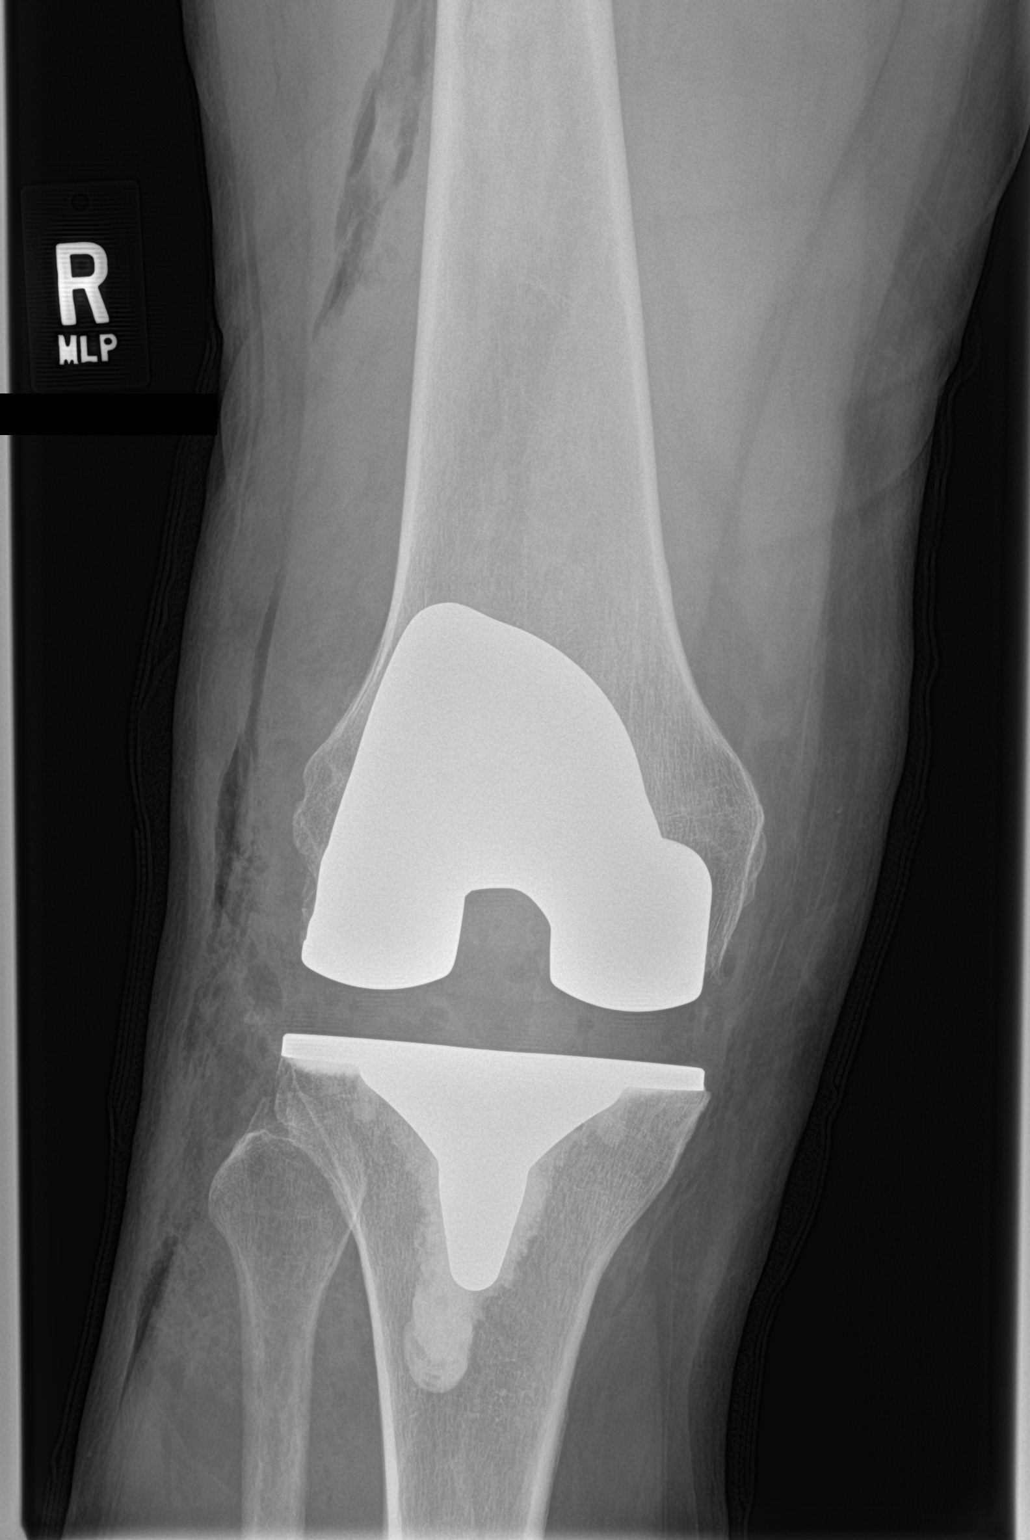

[knee lat]
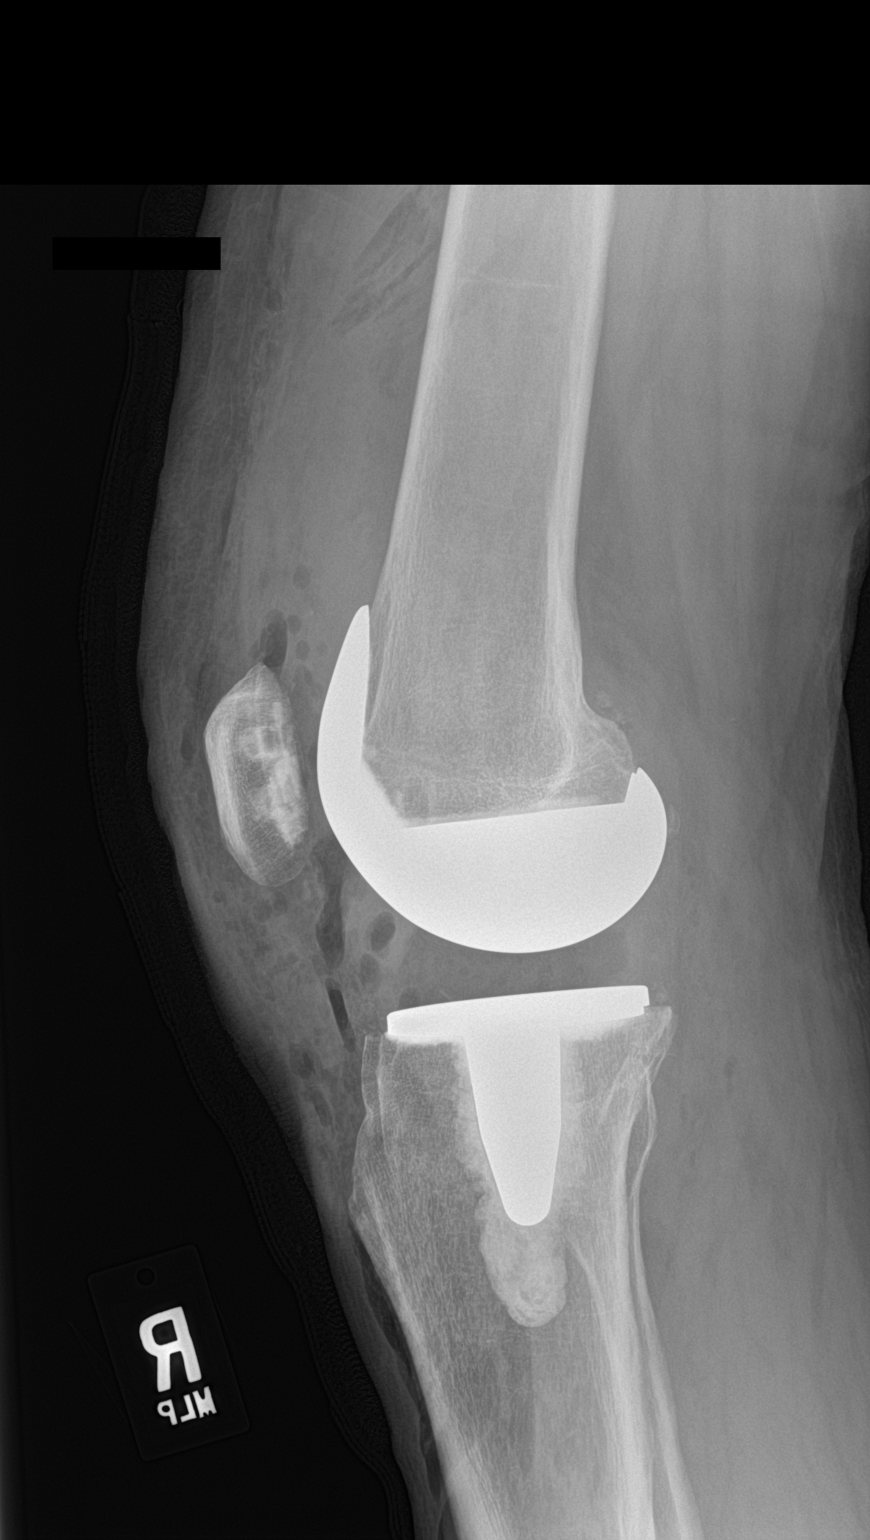

[2 of 2 positions shown; findings below may reference images not displayed]

FINDINGS: Osseous mineralization low normal.

Components of RIGHT knee prosthesis identified in expected position.

No acute fracture, dislocation, or bone destruction.
IMPRESSION: RIGHT knee prosthesis without acute complication.

## 2022-07-28 DIAGNOSIS — I2510 Atherosclerotic heart disease of native coronary artery without angina pectoris: Secondary | ICD-10-CM | POA: Diagnosis not present

## 2022-07-28 DIAGNOSIS — E785 Hyperlipidemia, unspecified: Secondary | ICD-10-CM | POA: Diagnosis not present

## 2022-07-28 DIAGNOSIS — E7849 Other hyperlipidemia: Secondary | ICD-10-CM | POA: Diagnosis not present

## 2022-07-28 DIAGNOSIS — Z0000 Encounter for general adult medical examination without abnormal findings: Secondary | ICD-10-CM | POA: Diagnosis not present

## 2022-07-28 DIAGNOSIS — E7800 Pure hypercholesterolemia, unspecified: Secondary | ICD-10-CM | POA: Diagnosis not present

## 2022-07-28 DIAGNOSIS — Z125 Encounter for screening for malignant neoplasm of prostate: Secondary | ICD-10-CM | POA: Diagnosis not present

## 2022-07-28 DIAGNOSIS — I10 Essential (primary) hypertension: Secondary | ICD-10-CM | POA: Diagnosis not present

## 2022-07-28 DIAGNOSIS — E039 Hypothyroidism, unspecified: Secondary | ICD-10-CM | POA: Diagnosis not present

## 2022-08-04 DIAGNOSIS — Z0000 Encounter for general adult medical examination without abnormal findings: Secondary | ICD-10-CM | POA: Diagnosis not present

## 2022-08-04 DIAGNOSIS — E7800 Pure hypercholesterolemia, unspecified: Secondary | ICD-10-CM | POA: Diagnosis not present

## 2022-08-04 DIAGNOSIS — D692 Other nonthrombocytopenic purpura: Secondary | ICD-10-CM | POA: Diagnosis not present

## 2022-08-04 DIAGNOSIS — I252 Old myocardial infarction: Secondary | ICD-10-CM | POA: Diagnosis not present

## 2022-08-04 DIAGNOSIS — Z6830 Body mass index (BMI) 30.0-30.9, adult: Secondary | ICD-10-CM | POA: Diagnosis not present

## 2022-08-04 DIAGNOSIS — E669 Obesity, unspecified: Secondary | ICD-10-CM | POA: Diagnosis not present

## 2022-08-04 DIAGNOSIS — Z1339 Encounter for screening examination for other mental health and behavioral disorders: Secondary | ICD-10-CM | POA: Diagnosis not present

## 2022-08-04 DIAGNOSIS — R7309 Other abnormal glucose: Secondary | ICD-10-CM | POA: Diagnosis not present

## 2022-08-04 DIAGNOSIS — Z1331 Encounter for screening for depression: Secondary | ICD-10-CM | POA: Diagnosis not present

## 2022-08-04 DIAGNOSIS — I2510 Atherosclerotic heart disease of native coronary artery without angina pectoris: Secondary | ICD-10-CM | POA: Diagnosis not present

## 2022-08-04 DIAGNOSIS — I10 Essential (primary) hypertension: Secondary | ICD-10-CM | POA: Diagnosis not present

## 2022-08-04 DIAGNOSIS — E039 Hypothyroidism, unspecified: Secondary | ICD-10-CM | POA: Diagnosis not present

## 2022-09-23 ENCOUNTER — Other Ambulatory Visit: Payer: Self-pay | Admitting: Medical

## 2022-09-23 ENCOUNTER — Ambulatory Visit
Admission: RE | Admit: 2022-09-23 | Discharge: 2022-09-23 | Disposition: A | Discharge status: Home / Self Care | Payer: Medicare Other | Source: Ambulatory Visit | Attending: Medical | Admitting: Medical

## 2022-09-23 DIAGNOSIS — M79672 Pain in left foot: Secondary | ICD-10-CM

## 2022-09-23 DIAGNOSIS — M25572 Pain in left ankle and joints of left foot: Secondary | ICD-10-CM | POA: Diagnosis not present

## 2022-09-23 DIAGNOSIS — M7989 Other specified soft tissue disorders: Secondary | ICD-10-CM | POA: Diagnosis not present

## 2022-09-24 DIAGNOSIS — S9782XA Crushing injury of left foot, initial encounter: Secondary | ICD-10-CM | POA: Diagnosis not present

## 2022-09-28 DIAGNOSIS — K5900 Constipation, unspecified: Secondary | ICD-10-CM | POA: Diagnosis not present

## 2022-09-28 DIAGNOSIS — Z1211 Encounter for screening for malignant neoplasm of colon: Secondary | ICD-10-CM | POA: Diagnosis not present

## 2022-09-28 DIAGNOSIS — K5730 Diverticulosis of large intestine without perforation or abscess without bleeding: Secondary | ICD-10-CM | POA: Diagnosis not present

## 2022-09-28 DIAGNOSIS — Z86010 Personal history of colonic polyps: Secondary | ICD-10-CM | POA: Diagnosis not present

## 2022-09-28 DIAGNOSIS — E039 Hypothyroidism, unspecified: Secondary | ICD-10-CM | POA: Diagnosis not present

## 2022-10-01 DIAGNOSIS — S9782XA Crushing injury of left foot, initial encounter: Secondary | ICD-10-CM | POA: Diagnosis not present

## 2022-11-12 DIAGNOSIS — Z860100 Personal history of colon polyps, unspecified: Secondary | ICD-10-CM | POA: Diagnosis not present

## 2022-11-12 DIAGNOSIS — Z1211 Encounter for screening for malignant neoplasm of colon: Secondary | ICD-10-CM | POA: Diagnosis not present

## 2022-11-12 DIAGNOSIS — S9782XA Crushing injury of left foot, initial encounter: Secondary | ICD-10-CM | POA: Diagnosis not present

## 2022-11-25 DIAGNOSIS — M79672 Pain in left foot: Secondary | ICD-10-CM | POA: Diagnosis not present

## 2023-01-13 DIAGNOSIS — I10 Essential (primary) hypertension: Secondary | ICD-10-CM | POA: Diagnosis not present

## 2023-01-13 DIAGNOSIS — E039 Hypothyroidism, unspecified: Secondary | ICD-10-CM | POA: Diagnosis not present

## 2023-01-13 DIAGNOSIS — I252 Old myocardial infarction: Secondary | ICD-10-CM | POA: Diagnosis not present

## 2023-01-13 DIAGNOSIS — Z23 Encounter for immunization: Secondary | ICD-10-CM | POA: Diagnosis not present

## 2023-01-13 DIAGNOSIS — E663 Overweight: Secondary | ICD-10-CM | POA: Diagnosis not present

## 2023-01-13 DIAGNOSIS — N200 Calculus of kidney: Secondary | ICD-10-CM | POA: Diagnosis not present

## 2023-01-13 DIAGNOSIS — E7800 Pure hypercholesterolemia, unspecified: Secondary | ICD-10-CM | POA: Diagnosis not present

## 2023-01-13 DIAGNOSIS — S9782XD Crushing injury of left foot, subsequent encounter: Secondary | ICD-10-CM | POA: Diagnosis not present

## 2023-01-13 DIAGNOSIS — G4700 Insomnia, unspecified: Secondary | ICD-10-CM | POA: Diagnosis not present

## 2023-01-13 DIAGNOSIS — D126 Benign neoplasm of colon, unspecified: Secondary | ICD-10-CM | POA: Diagnosis not present

## 2023-01-13 DIAGNOSIS — D692 Other nonthrombocytopenic purpura: Secondary | ICD-10-CM | POA: Diagnosis not present

## 2023-01-13 DIAGNOSIS — R7309 Other abnormal glucose: Secondary | ICD-10-CM | POA: Diagnosis not present

## 2023-01-13 DIAGNOSIS — I2510 Atherosclerotic heart disease of native coronary artery without angina pectoris: Secondary | ICD-10-CM | POA: Diagnosis not present

## 2023-05-12 DIAGNOSIS — H02105 Unspecified ectropion of left lower eyelid: Secondary | ICD-10-CM | POA: Diagnosis not present

## 2023-05-12 DIAGNOSIS — H02102 Unspecified ectropion of right lower eyelid: Secondary | ICD-10-CM | POA: Diagnosis not present

## 2023-05-12 DIAGNOSIS — H02101 Unspecified ectropion of right upper eyelid: Secondary | ICD-10-CM | POA: Diagnosis not present

## 2023-05-12 DIAGNOSIS — H02104 Unspecified ectropion of left upper eyelid: Secondary | ICD-10-CM | POA: Diagnosis not present

## 2023-06-23 DIAGNOSIS — H02134 Senile ectropion of left upper eyelid: Secondary | ICD-10-CM | POA: Diagnosis not present

## 2023-06-23 DIAGNOSIS — H04223 Epiphora due to insufficient drainage, bilateral lacrimal glands: Secondary | ICD-10-CM | POA: Diagnosis not present

## 2023-06-23 DIAGNOSIS — H02135 Senile ectropion of left lower eyelid: Secondary | ICD-10-CM | POA: Diagnosis not present

## 2023-06-23 DIAGNOSIS — H04563 Stenosis of bilateral lacrimal punctum: Secondary | ICD-10-CM | POA: Diagnosis not present

## 2023-06-23 DIAGNOSIS — H04543 Stenosis of bilateral lacrimal canaliculi: Secondary | ICD-10-CM | POA: Diagnosis not present

## 2023-06-23 DIAGNOSIS — H02132 Senile ectropion of right lower eyelid: Secondary | ICD-10-CM | POA: Diagnosis not present

## 2023-06-23 DIAGNOSIS — Z01818 Encounter for other preprocedural examination: Secondary | ICD-10-CM | POA: Diagnosis not present

## 2023-06-23 DIAGNOSIS — H02131 Senile ectropion of right upper eyelid: Secondary | ICD-10-CM | POA: Diagnosis not present

## 2023-07-06 DIAGNOSIS — H02134 Senile ectropion of left upper eyelid: Secondary | ICD-10-CM | POA: Diagnosis not present

## 2023-07-06 DIAGNOSIS — H04223 Epiphora due to insufficient drainage, bilateral lacrimal glands: Secondary | ICD-10-CM | POA: Diagnosis not present

## 2023-07-06 DIAGNOSIS — Z01818 Encounter for other preprocedural examination: Secondary | ICD-10-CM | POA: Diagnosis not present

## 2023-07-06 DIAGNOSIS — H04543 Stenosis of bilateral lacrimal canaliculi: Secondary | ICD-10-CM | POA: Diagnosis not present

## 2023-07-06 DIAGNOSIS — H04563 Stenosis of bilateral lacrimal punctum: Secondary | ICD-10-CM | POA: Diagnosis not present

## 2023-07-06 DIAGNOSIS — H02132 Senile ectropion of right lower eyelid: Secondary | ICD-10-CM | POA: Diagnosis not present

## 2023-07-06 DIAGNOSIS — H02131 Senile ectropion of right upper eyelid: Secondary | ICD-10-CM | POA: Diagnosis not present

## 2023-07-06 DIAGNOSIS — H02135 Senile ectropion of left lower eyelid: Secondary | ICD-10-CM | POA: Diagnosis not present

## 2023-07-21 DIAGNOSIS — H02131 Senile ectropion of right upper eyelid: Secondary | ICD-10-CM | POA: Diagnosis not present

## 2023-07-21 DIAGNOSIS — H02134 Senile ectropion of left upper eyelid: Secondary | ICD-10-CM | POA: Diagnosis not present

## 2023-07-21 DIAGNOSIS — H02135 Senile ectropion of left lower eyelid: Secondary | ICD-10-CM | POA: Diagnosis not present

## 2023-07-21 DIAGNOSIS — H02132 Senile ectropion of right lower eyelid: Secondary | ICD-10-CM | POA: Diagnosis not present

## 2023-07-21 DIAGNOSIS — H04543 Stenosis of bilateral lacrimal canaliculi: Secondary | ICD-10-CM | POA: Diagnosis not present

## 2023-07-21 DIAGNOSIS — H04563 Stenosis of bilateral lacrimal punctum: Secondary | ICD-10-CM | POA: Diagnosis not present

## 2023-07-21 DIAGNOSIS — Z09 Encounter for follow-up examination after completed treatment for conditions other than malignant neoplasm: Secondary | ICD-10-CM | POA: Diagnosis not present

## 2023-07-21 DIAGNOSIS — H04223 Epiphora due to insufficient drainage, bilateral lacrimal glands: Secondary | ICD-10-CM | POA: Diagnosis not present

## 2023-08-11 DIAGNOSIS — I10 Essential (primary) hypertension: Secondary | ICD-10-CM | POA: Diagnosis not present

## 2023-08-11 DIAGNOSIS — Z1212 Encounter for screening for malignant neoplasm of rectum: Secondary | ICD-10-CM | POA: Diagnosis not present

## 2023-08-11 DIAGNOSIS — E039 Hypothyroidism, unspecified: Secondary | ICD-10-CM | POA: Diagnosis not present

## 2023-08-11 DIAGNOSIS — E7800 Pure hypercholesterolemia, unspecified: Secondary | ICD-10-CM | POA: Diagnosis not present

## 2023-08-11 DIAGNOSIS — Z125 Encounter for screening for malignant neoplasm of prostate: Secondary | ICD-10-CM | POA: Diagnosis not present

## 2023-08-18 DIAGNOSIS — I2510 Atherosclerotic heart disease of native coronary artery without angina pectoris: Secondary | ICD-10-CM | POA: Diagnosis not present

## 2023-08-18 DIAGNOSIS — I119 Hypertensive heart disease without heart failure: Secondary | ICD-10-CM | POA: Diagnosis not present

## 2023-08-18 DIAGNOSIS — R7309 Other abnormal glucose: Secondary | ICD-10-CM | POA: Diagnosis not present

## 2023-08-18 DIAGNOSIS — Z1331 Encounter for screening for depression: Secondary | ICD-10-CM | POA: Diagnosis not present

## 2023-08-18 DIAGNOSIS — G4700 Insomnia, unspecified: Secondary | ICD-10-CM | POA: Diagnosis not present

## 2023-08-18 DIAGNOSIS — Z0000 Encounter for general adult medical examination without abnormal findings: Secondary | ICD-10-CM | POA: Diagnosis not present

## 2023-08-18 DIAGNOSIS — E039 Hypothyroidism, unspecified: Secondary | ICD-10-CM | POA: Diagnosis not present

## 2023-08-18 DIAGNOSIS — D692 Other nonthrombocytopenic purpura: Secondary | ICD-10-CM | POA: Diagnosis not present

## 2023-08-18 DIAGNOSIS — Z1339 Encounter for screening examination for other mental health and behavioral disorders: Secondary | ICD-10-CM | POA: Diagnosis not present

## 2023-08-18 DIAGNOSIS — E663 Overweight: Secondary | ICD-10-CM | POA: Diagnosis not present

## 2023-08-18 DIAGNOSIS — D126 Benign neoplasm of colon, unspecified: Secondary | ICD-10-CM | POA: Diagnosis not present

## 2023-08-18 DIAGNOSIS — E7800 Pure hypercholesterolemia, unspecified: Secondary | ICD-10-CM | POA: Diagnosis not present

## 2023-08-18 DIAGNOSIS — R82998 Other abnormal findings in urine: Secondary | ICD-10-CM | POA: Diagnosis not present

## 2023-08-31 DIAGNOSIS — M542 Cervicalgia: Secondary | ICD-10-CM | POA: Diagnosis not present

## 2023-09-01 DIAGNOSIS — H02132 Senile ectropion of right lower eyelid: Secondary | ICD-10-CM | POA: Diagnosis not present

## 2023-09-01 DIAGNOSIS — H0279 Other degenerative disorders of eyelid and periocular area: Secondary | ICD-10-CM | POA: Diagnosis not present

## 2023-09-01 DIAGNOSIS — H04223 Epiphora due to insufficient drainage, bilateral lacrimal glands: Secondary | ICD-10-CM | POA: Diagnosis not present

## 2023-09-01 DIAGNOSIS — Z09 Encounter for follow-up examination after completed treatment for conditions other than malignant neoplasm: Secondary | ICD-10-CM | POA: Diagnosis not present

## 2023-09-01 DIAGNOSIS — H04563 Stenosis of bilateral lacrimal punctum: Secondary | ICD-10-CM | POA: Diagnosis not present

## 2023-09-01 DIAGNOSIS — H02131 Senile ectropion of right upper eyelid: Secondary | ICD-10-CM | POA: Diagnosis not present

## 2023-09-01 DIAGNOSIS — H04543 Stenosis of bilateral lacrimal canaliculi: Secondary | ICD-10-CM | POA: Diagnosis not present

## 2023-09-01 DIAGNOSIS — H02134 Senile ectropion of left upper eyelid: Secondary | ICD-10-CM | POA: Diagnosis not present

## 2023-09-01 DIAGNOSIS — H02135 Senile ectropion of left lower eyelid: Secondary | ICD-10-CM | POA: Diagnosis not present

## 2023-09-24 DIAGNOSIS — M542 Cervicalgia: Secondary | ICD-10-CM | POA: Diagnosis not present

## 2023-09-30 DIAGNOSIS — M5030 Other cervical disc degeneration, unspecified cervical region: Secondary | ICD-10-CM | POA: Diagnosis not present

## 2023-11-01 DIAGNOSIS — M5412 Radiculopathy, cervical region: Secondary | ICD-10-CM | POA: Diagnosis not present

## 2023-11-03 DIAGNOSIS — M5412 Radiculopathy, cervical region: Secondary | ICD-10-CM | POA: Diagnosis not present
